# Patient Record
Sex: Female | Born: 1950 | Race: Black or African American | Hispanic: No | Marital: Married | Smoking: Current every day smoker
Health system: Southern US, Community
[De-identification: ages and names within clinical notes are randomized; demographics above are authoritative.]

## PROBLEM LIST (undated history)

## (undated) DIAGNOSIS — E119 Type 2 diabetes mellitus without complications: Secondary | ICD-10-CM

## (undated) DIAGNOSIS — E785 Hyperlipidemia, unspecified: Secondary | ICD-10-CM

## (undated) DIAGNOSIS — I10 Essential (primary) hypertension: Secondary | ICD-10-CM

## (undated) HISTORY — DX: Hyperlipidemia, unspecified: E78.5

## (undated) HISTORY — PX: ABDOMINAL HYSTERECTOMY: SHX81

## (undated) HISTORY — DX: Essential (primary) hypertension: I10

## (undated) HISTORY — DX: Type 2 diabetes mellitus without complications: E11.9

---

## 2016-11-08 LAB — HM MAMMOGRAPHY

## 2017-02-04 LAB — HEMOGLOBIN A1C: Hemoglobin A1C: 6.3 % — AB (ref 4.0–5.6)

## 2017-02-04 LAB — LIPID PANEL
CHOLESTEROL: 169 (ref 0–200)
HDL: 36 (ref 35–70)
LDL Cholesterol: 108
Triglycerides: 136 (ref 40–160)

## 2017-04-14 DIAGNOSIS — R69 Illness, unspecified: Secondary | ICD-10-CM | POA: Diagnosis not present

## 2017-04-23 ENCOUNTER — Other Ambulatory Visit: Payer: Self-pay

## 2017-04-23 ENCOUNTER — Ambulatory Visit (INDEPENDENT_AMBULATORY_CARE_PROVIDER_SITE_OTHER): Payer: Medicare HMO | Admitting: Nurse Practitioner

## 2017-04-23 ENCOUNTER — Encounter: Payer: Self-pay | Admitting: Nurse Practitioner

## 2017-04-23 VITALS — BP 167/102 | HR 79 | Temp 97.7°F | Ht 62.0 in | Wt 188.4 lb

## 2017-04-23 DIAGNOSIS — E1142 Type 2 diabetes mellitus with diabetic polyneuropathy: Secondary | ICD-10-CM | POA: Diagnosis not present

## 2017-04-23 DIAGNOSIS — I10 Essential (primary) hypertension: Secondary | ICD-10-CM | POA: Diagnosis not present

## 2017-04-23 DIAGNOSIS — Z7689 Persons encountering health services in other specified circumstances: Secondary | ICD-10-CM

## 2017-04-23 DIAGNOSIS — E785 Hyperlipidemia, unspecified: Secondary | ICD-10-CM

## 2017-04-23 DIAGNOSIS — R69 Illness, unspecified: Secondary | ICD-10-CM | POA: Diagnosis not present

## 2017-04-23 DIAGNOSIS — F17200 Nicotine dependence, unspecified, uncomplicated: Secondary | ICD-10-CM

## 2017-04-23 MED ORDER — ATORVASTATIN CALCIUM 80 MG PO TABS
80.0000 mg | ORAL_TABLET | Freq: Every day | ORAL | 3 refills | Status: DC
Start: 2017-04-23 — End: 2017-12-09

## 2017-04-23 MED ORDER — CHLORTHALIDONE 25 MG PO TABS: 12.5000 mg | ORAL_TABLET | Freq: Every day | ORAL | 3 refills | Status: DC

## 2017-04-23 NOTE — Patient Instructions (Addendum)
Alison Mays,   Thank you for coming in to clinic today.  1. For breast tenderness: - START Aleeve or naproxen sodium 440 mg twice daily for 14 days.  DO not take more than 14 days as it can worsen blood pressure. - AVOID caffeine.  Work toward drinking decaffeinated coffee.  At most, have 3 cups of caffeinated coffee per day.  2. CHANGE simvastatin to atorvastatin 80 mg once daily.  3. For blood pressure: - CONTINUE metoprolol - START chlorthalidone 25 mg tablet - Take half a tablet once daily for a dose of 12.5 mg. - Work on eating a low salt diet.  Please schedule a follow-up appointment with Alison Mays, AGNP. Return in about 1 month (around 05/21/2017) for diabetes, hypertension.  If you have any other questions or concerns, please feel free to call the clinic or send a message through [place]. You may also schedule an earlier appointment if necessary.  You will receive a survey after today's visit either digitally by e-mail or paper by [employer]. Your experiences and feedback matter to [place].  Please respond so we know how we are doing as we provide care for you.   Alison Smiles, DNP, AGNP-BC Adult Gerontology Nurse Practitioner [facility], [facility]   Managing Your Hypertension Hypertension is commonly called high blood pressure. This is when the force of your blood pressing against the walls of your arteries is too strong. Arteries are blood vessels that carry blood from your heart throughout your body. Hypertension forces the heart to work harder to pump blood, and may cause the arteries to become narrow or stiff. Having untreated or uncontrolled hypertension can cause heart attack, stroke, kidney disease, and other problems. What are blood pressure readings? A blood pressure reading consists of a higher number over a lower number. Ideally, your blood pressure should be below 120/80. The first ("top") number is called the systolic pressure. It is a measure of the  pressure in your arteries as your heart beats. The second ("bottom") number is called the diastolic pressure. It is a measure of the pressure in your arteries as the heart relaxes. What does my blood pressure reading mean? Blood pressure is classified into four stages. Based on your blood pressure reading, your health care provider may use the following stages to determine what type of treatment you need, if any. Systolic pressure and diastolic pressure are measured in a unit called mm Hg. Normal  Systolic pressure: below 128.  Diastolic pressure: below 80. Elevated  Systolic pressure: 786-767.  Diastolic pressure: below 80. Hypertension stage 1  Systolic pressure: 209-470.  Diastolic pressure: 96-28. Hypertension stage 2  Systolic pressure: 366 or above.  Diastolic pressure: 90 or above. What health risks are associated with hypertension? Managing your hypertension is an important responsibility. Uncontrolled hypertension can lead to:  A heart attack.  A stroke.  A weakened blood vessel (aneurysm).  Heart failure.  Kidney damage.  Eye damage.  Metabolic syndrome.  Memory and concentration problems.  What changes can I make to manage my hypertension? Hypertension can be managed by making lifestyle changes and possibly by taking medicines. Your health care provider will help you make a plan to bring your blood pressure within a normal range. Eating and drinking  Eat a diet that is high in fiber and potassium, and low in salt (sodium), added sugar, and fat. An example eating plan is called the DASH (Dietary Approaches to Stop Hypertension) diet. To eat this way: ? Eat  plenty of fresh fruits and vegetables. Try to fill half of your plate at each meal with fruits and vegetables. ? Eat whole grains, such as whole wheat pasta, brown rice, or whole grain bread. Fill about one quarter of your plate with whole grains. ? Eat low-fat diary products. ? Avoid fatty cuts of meat,  processed or cured meats, and poultry with skin. Fill about one quarter of your plate with lean proteins such as fish, chicken without skin, beans, eggs, and tofu. ? Avoid premade and processed foods. These tend to be higher in sodium, added sugar, and fat.  Reduce your daily sodium intake. Most people with hypertension should eat less than 1,500 mg of sodium a day.  Limit alcohol intake to no more than 1 drink a day for nonpregnant women and 2 drinks a day for men. One drink equals 12 oz of beer, 5 oz of wine, or 1 oz of hard liquor. Lifestyle  Work with your health care provider to maintain a healthy body weight, or to lose weight. Ask what an ideal weight is for you.  Get at least 30 minutes of exercise that causes your heart to beat faster (aerobic exercise) most days of the week. Activities may include walking, swimming, or biking.  Include exercise to strengthen your muscles (resistance exercise), such as weight lifting, as part of your weekly exercise routine. Try to do these types of exercises for 30 minutes at least 3 days a week.  Do not use any products that contain nicotine or tobacco, such as cigarettes and e-cigarettes. If you need help quitting, ask your health care provider.  Control any long-term (chronic) conditions you have, such as high cholesterol or diabetes. Monitoring  Monitor your blood pressure at home as told by your health care provider. Your personal target blood pressure may vary depending on your medical conditions, your age, and other factors.  Have your blood pressure checked regularly, as often as told by your health care provider. Working with your health care provider  Review all the medicines you take with your health care provider because there may be side effects or interactions.  Talk with your health care provider about your diet, exercise habits, and other lifestyle factors that may be contributing to hypertension.  Visit your health care  provider regularly. Your health care provider can help you create and adjust your plan for managing hypertension. Will I need medicine to control my blood pressure? Your health care provider may prescribe medicine if lifestyle changes are not enough to get your blood pressure under control, and if:  Your systolic blood pressure is 130 or higher.  Your diastolic blood pressure is 80 or higher.  Take medicines only as told by your health care provider. Follow the directions carefully. Blood pressure medicines must be taken as prescribed. The medicine does not work as well when you skip doses. Skipping doses also puts you at risk for problems. Contact a health care provider if:  You think you are having a reaction to medicines you have taken.  You have repeated (recurrent) headaches.  You feel dizzy.  You have swelling in your ankles.  You have trouble with your vision. Get help right away if:  You develop a severe headache or confusion.  You have unusual weakness or numbness, or you feel faint.  You have severe pain in your chest or abdomen.  You vomit repeatedly.  You have trouble breathing. Summary  Hypertension is when the force of blood pumping through  your arteries is too strong. If this condition is not controlled, it may put you at risk for serious complications.  Your personal target blood pressure may vary depending on your medical conditions, your age, and other factors. For most people, a normal blood pressure is less than 120/80.  Hypertension is managed by lifestyle changes, medicines, or both. Lifestyle changes include weight loss, eating a healthy, low-sodium diet, exercising more, and limiting alcohol. This information is not intended to replace advice given to you by your health care provider. Make sure you discuss any questions you have with your health care provider. Document Released: 10/02/2011 Document Revised: 12/06/2015 Document Reviewed:  12/06/2015 Elsevier Interactive Patient Education  [employer].

## 2017-04-23 NOTE — Progress Notes (Signed)
Subjective:    Patient ID: Alison Mays, female    DOB: Jan 15, 1951, 67 y.o.   MRN: [id number]  Alison Mays is a 67 y.o. female presenting on 04/23/2017 for Establish Care (recently moved to the area from [place], [place]. Bilateral breast tenderness, mammogram done 01/2017 informed she had Fibrocystic breasts )   HPI  [place] Provider Pt last seen by PCP about 4 months ago.  Obtain records from PCP Dr. Gareth Morgan  Ph: [phone]. Pt has been cared for by him for last 6-8 months.  Prior care was at [facility]  Breast Tenderness Patient reports significantly worsening breast tenderness that bothers her if she has any increased pressure applied to her breasts.  She notes she has recently had mammogram in January 2019 for screening. That exam revealed no cancer, but pt reports she has fibrocystic breasts.  Pt reports she had breast tenderness at the time of her mammogram, but it was much more mild.  Pt is postmenopausal s/p hysterectomy.  She has no prior history of breast ca.   - Pt drinks 12-15 cups per day caffeinated coffee.  Hyperlipidemia Pt has T2DM and is an active smoker.  Is taking simvastatin 80 mg once daily.  With this medication pt states she is having arthralgias that are worst with standing.  Occur throughout the day.  She did not experience these until she started taking this high-dose simvastatin.  Social History   Socioeconomic History  . Marital status: Married    Spouse name: Not on file  . Number of children: Not on file  . Years of education: Not on file  . Highest education level: Not on file  Occupational History  . Not on file  Social Needs  . Financial resource strain: Not on file  . Food insecurity:    Worry: Not on file    Inability: Not on file  . Transportation needs:    Medical: Not on file    Non-medical: Not on file  Tobacco Use  . Smoking status: Current Every Day Smoker    Packs/day: 0.25    Years: 10.00   Pack years: 2.50    Types: Cigarettes  . Smokeless tobacco: Never Used  Substance and Sexual Activity  . Alcohol use: Never    Frequency: Never  . Drug use: Never  . Sexual activity: Not on file  Lifestyle  . Physical activity:    Days per week: Not on file    Minutes per session: Not on file  . Stress: Not on file  Relationships  . Social connections:    Talks on phone: Not on file    Gets together: Not on file    Attends religious service: Not on file    Active member of club or organization: Not on file    Attends meetings of clubs or organizations: Not on file    Relationship status: Not on file  . Intimate partner violence:    Fear of current or ex partner: Not on file    Emotionally abused: Not on file    Physically abused: Not on file    Forced sexual activity: Not on file  Other Topics Concern  . Not on file  Social History Narrative  . Not on file   Family History  Problem Relation Age of Onset  . Healthy Mother   . Diabetes Father   . Heart attack Sister   . Diabetes Brother   . Diabetes Sister   .  Stomach cancer Sister   . Healthy Son   . Healthy Daughter    Past Medical History:  Diagnosis Date  . Diabetes mellitus without complication ([place])   . Hyperlipidemia   . Hypertension    Past Surgical History:  Procedure Laterality Date  . ABDOMINAL HYSTERECTOMY     Outpatient Encounter Medications as of 04/23/2017  Medication Sig  . cyclobenzaprine (FLEXERIL) 10 MG tablet Take 10 mg by mouth 3 (three) times daily as needed for muscle spasms.  Marland Kitchen gabapentin (NEURONTIN) 300 MG capsule Take 300 mg by mouth 3 (three) times daily.  Marland Kitchen glipiZIDE (GLUCOTROL) 10 MG tablet Take 10 mg by mouth 2 (two) times daily before a meal.  . losartan (COZAAR) 100 MG tablet Take 100 mg by mouth daily.  . metFORMIN (GLUCOPHAGE) 500 MG tablet Take 500 mg by mouth 2 (two) times daily with a meal.  . metoprolol tartrate (LOPRESSOR) 50 MG tablet Take 50 mg by mouth 2 (two) times  daily.  . [DISCONTINUED] simvastatin (ZOCOR) 80 MG tablet Take 80 mg by mouth daily.  Marland Kitchen atorvastatin (LIPITOR) 80 MG tablet Take 1 tablet (80 mg total) by mouth daily.  . chlorthalidone (HYGROTON) 25 MG tablet Take 0.5 tablets (12.5 mg total) by mouth daily.   No facility-administered encounter medications on file as of 04/23/2017.      Review of Systems  Constitutional: Negative.   HENT: Negative.   Eyes: Negative.   Respiratory: Negative.   Cardiovascular: Negative.   Gastrointestinal: Negative.   Endocrine: Negative.   Genitourinary:       Bilateral breast tenderness  Musculoskeletal: Positive for arthralgias.  Skin: Negative.   Allergic/Immunologic: Negative.   Neurological: Positive for numbness (left foot).  Hematological: Negative.   Psychiatric/Behavioral: Negative.    Per HPI unless specifically indicated above    Objective:    BP (!) 167/102 (BP Location: Right Arm, Patient Position: Sitting, Cuff Size: Normal)   Pulse 79   Temp 97.7 F (36.5 C) (Oral)   Ht 5\' 2"  (1.575 m)   Wt 188 lb 6.4 oz (85.5 kg)   BMI 34.46 kg/m   Wt Readings from Last 3 Encounters:  04/23/17 188 lb 6.4 oz (85.5 kg)    Physical Exam  Constitutional: She is oriented to person, place, and time. She appears well-developed and well-nourished. No distress.  HENT:  Head: Normocephalic and atraumatic.  Neck: Normal range of motion. Neck supple. Carotid bruit is not present.  Cardiovascular: Normal rate, regular rhythm, S1 normal, S2 normal, normal heart sounds and intact distal pulses.  No murmur heard. Pulmonary/Chest: Effort normal and breath sounds normal. No respiratory distress.  Breast - Normal exam w/ symmetric breasts, no mass, no nipple discharge, no skin changes.  Equal bilateral tenderness noted, more severe at nipples/areola.  No other abnormalities noted.    Musculoskeletal: She exhibits no edema (pedal).  Neurological: She is alert and oriented to person, place, and time.    Skin: Skin is warm and dry.  Psychiatric: She has a normal mood and affect. Her behavior is normal. Judgment and thought content normal.  Vitals reviewed.     Assessment & Plan:   Problem List Items Addressed This Visit      Cardiovascular and Mediastinum   Hypertension    Uncontrolled hypertension.  BP goal < 130/80.  Pt is not currently working on lifestyle modifications.  Taking medications tolerating well without side effects.   Plan: 1. Continue taking metoprolol 50 mg twice daily, losartan 100 mg  once daily - START chlorthalidone 12.5 mg once daily (take 1/2 of 25 mg tablet).  Instructed pt to purchase pill cutter.  Will monitor for hair loss.  Pt had hair loss with Triamterene-HCTZ in past. 2. Obtain labs CMP, lipid  3. Encouraged heart healthy diet and increasing exercise to 30 minutes most days of the week. 4. Check BP 1-2 x per week at home, keep log, and bring to clinic at next appointment. 5. Follow up 4 weeks.        Relevant Medications   metoprolol tartrate (LOPRESSOR) 50 MG tablet   losartan (COZAAR) 100 MG tablet   chlorthalidone (HYGROTON) 25 MG tablet   atorvastatin (LIPITOR) 80 MG tablet     Endocrine   Type 2 diabetes mellitus with peripheral neuropathy (HCC)    Unknown status for DM without prior A1c value available. Goal A1c < 6.3%. - Known complications of hyperlipidemia and left foot neuropathy.  Plan:  1. Continue current therapy: glipizide 10 mg bid wc, metformin 500 mg bid wc 2. Encourage improved lifestyle: - low carb/low glycemic diet handout provided - Increase physical activity to 30 minutes most days of the week.  Explained that increased physical activity increases body's use of sugar for energy. 3. Check fasting am CBG and bring log to next visit for review. 4. Continue ARB and Statin - changed from simvastatin 80 mg to atorvastatin 80 mg once daily 2/2 arthralgias at high dose simvastatin 5. DM Foot exam done today with normal findings.    and Advised to schedule DM ophtho exam, send record. 6. Follow-up 1 month for A1c       Relevant Medications   cyclobenzaprine (FLEXERIL) 10 MG tablet   gabapentin (NEURONTIN) 300 MG capsule   glipiZIDE (GLUCOTROL) 10 MG tablet   metFORMIN (GLUCOPHAGE) 500 MG tablet   losartan (COZAAR) 100 MG tablet   atorvastatin (LIPITOR) 80 MG tablet     Other   Hyperlipidemia - Primary    Current status unknown.  Pt with prior labs collected at former PCP.  Records to be requested.  Arthralgias on simvastatin 80 mg.  Will change to atorvastatin 80 mg for lipid control with high intensity statin in pt with diabetes.  Pt verbalizes understanding.  Labs at next visit after review of prior records.      Relevant Medications   metoprolol tartrate (LOPRESSOR) 50 MG tablet   losartan (COZAAR) 100 MG tablet   chlorthalidone (HYGROTON) 25 MG tablet   atorvastatin (LIPITOR) 80 MG tablet   Current every day smoker    Patient is active and current smoker with half pack per day.  Patient has smoking history of 2 packs/day with reduction of smoking over the last 6 months.  Discussion about smoking cessation during visit today less than 5 minutes.  Patient continues to understand impact of smoking on vascular system for heart attack and stroke risk in addition to lung cancer.  Declines additional treatment with medications at this time she has continued to be able to reduce her smoking amount.  Follow-up as needed in 1 month.        Other Visit Diagnoses    Encounter to establish care        Pt last seen by PCP about 4 months ago.  Obtain records from PCP Dr. Gareth Morgan  Ph: [phone]. Pt has been cared for by him for last 6-8 months.  Prior care was at [facility].  Past medical, family, and surgical history reviewed  w/ pt.     Meds ordered this encounter  Medications  . chlorthalidone (HYGROTON) 25 MG tablet    Sig: Take 0.5 tablets (12.5 mg total) by mouth daily.    Dispense:  15  tablet    Refill:  3    Order Specific Question:   Supervising Provider    Answer:   Olin Hauser [[id number]]  . atorvastatin (LIPITOR) 80 MG tablet    Sig: Take 1 tablet (80 mg total) by mouth daily.    Dispense:  90 tablet    Refill:  3    Order Specific Question:   Supervising Provider    Answer:   Olin Hauser [[id number]]    Follow up plan: Return in about 1 month (around 05/21/2017) for diabetes, hypertension.  [name], DNP, AGPCNP-BC Adult Gerontology Primary Care Nurse Practitioner [facility] 05/01/2017, 10:43 AM

## 2017-04-24 DIAGNOSIS — Z87891 Personal history of nicotine dependence: Secondary | ICD-10-CM | POA: Insufficient documentation

## 2017-04-24 DIAGNOSIS — E785 Hyperlipidemia, unspecified: Secondary | ICD-10-CM | POA: Insufficient documentation

## 2017-04-24 DIAGNOSIS — E1142 Type 2 diabetes mellitus with diabetic polyneuropathy: Secondary | ICD-10-CM | POA: Insufficient documentation

## 2017-04-24 NOTE — Assessment & Plan Note (Signed)
Uncontrolled hypertension.  BP goal < 130/80.  Pt is not currently working on lifestyle modifications.  Taking medications tolerating well without side effects.   Plan: 1. Continue taking metoprolol 50 mg twice daily, losartan 100 mg once daily - START chlorthalidone 12.5 mg once daily (take 1/2 of 25 mg tablet).  Instructed pt to purchase pill cutter.  Will monitor for hair loss.  Pt had hair loss with Triamterene-HCTZ in past. 2. Obtain labs CMP, lipid  3. Encouraged heart healthy diet and increasing exercise to 30 minutes most days of the week. 4. Check BP 1-2 x per week at home, keep log, and bring to clinic at next appointment. 5. Follow up 4 weeks.

## 2017-04-30 ENCOUNTER — Inpatient Hospital Stay
Admission: RE | Admit: 2017-04-30 | Discharge: 2017-04-30 | Disposition: A | Discharge status: Home / Self Care | Payer: Self-pay | Source: Ambulatory Visit | Attending: *Deleted | Admitting: *Deleted

## 2017-04-30 ENCOUNTER — Other Ambulatory Visit: Payer: Self-pay | Admitting: *Deleted

## 2017-04-30 DIAGNOSIS — Z9289 Personal history of other medical treatment: Secondary | ICD-10-CM

## 2017-05-01 ENCOUNTER — Other Ambulatory Visit: Payer: Self-pay | Admitting: Nurse Practitioner

## 2017-05-01 NOTE — Assessment & Plan Note (Signed)
Patient is active and current smoker with half pack per day.  Patient has smoking history of 2 packs/day with reduction of smoking over the last 6 months.  Discussion about smoking cessation during visit today less than 5 minutes.  Patient continues to understand impact of smoking on vascular system for heart attack and stroke risk in addition to lung cancer.  Declines additional treatment with medications at this time she has continued to be able to reduce her smoking amount.  Follow-up as needed in 1 month.

## 2017-05-01 NOTE — Assessment & Plan Note (Signed)
Current status unknown.  Pt with prior labs collected at former PCP.  Records to be requested.  Arthralgias on simvastatin 80 mg.  Will change to atorvastatin 80 mg for lipid control with high intensity statin in pt with diabetes.  Pt verbalizes understanding.  Labs at next visit after review of prior records.

## 2017-05-01 NOTE — Assessment & Plan Note (Signed)
Unknown status for DM without prior A1c value available. Goal A1c < 3.8%. - Known complications of hyperlipidemia and left foot neuropathy.  Plan:  1. Continue current therapy: glipizide 10 mg bid wc, metformin 500 mg bid wc 2. Encourage improved lifestyle: - low carb/low glycemic diet handout provided - Increase physical activity to 30 minutes most days of the week.  Explained that increased physical activity increases body's use of sugar for energy. 3. Check fasting am CBG and bring log to next visit for review. 4. Continue ARB and Statin - changed from simvastatin 80 mg to atorvastatin 80 mg once daily 2/2 arthralgias at high dose simvastatin 5. DM Foot exam done today with normal findings.   and Advised to schedule DM ophtho exam, send record. 6. Follow-up 1 month for A1c

## 2017-05-08 ENCOUNTER — Ambulatory Visit (INDEPENDENT_AMBULATORY_CARE_PROVIDER_SITE_OTHER): Payer: Medicare HMO | Admitting: Nurse Practitioner

## 2017-05-08 ENCOUNTER — Encounter: Payer: Self-pay | Admitting: Nurse Practitioner

## 2017-05-08 ENCOUNTER — Other Ambulatory Visit: Payer: Self-pay

## 2017-05-08 VITALS — BP 178/84 | HR 83 | Temp 98.1°F | Ht 63.0 in | Wt 186.0 lb

## 2017-05-08 DIAGNOSIS — E1142 Type 2 diabetes mellitus with diabetic polyneuropathy: Secondary | ICD-10-CM

## 2017-05-08 DIAGNOSIS — I10 Essential (primary) hypertension: Secondary | ICD-10-CM

## 2017-05-08 MED ORDER — GLIPIZIDE 10 MG PO TABS: 10.0000 mg | ORAL_TABLET | Freq: Two times a day (BID) | ORAL | 5 refills | Status: DC

## 2017-05-08 MED ORDER — CLONIDINE HCL 0.1 MG PO TABS
0.1000 mg | ORAL_TABLET | Freq: Once | ORAL | Status: AC
  Administered 2017-05-08: 0.1 mg via ORAL

## 2017-05-08 MED ORDER — CLONIDINE HCL 0.1 MG PO TABS
0.1000 mg | ORAL_TABLET | Freq: Once | ORAL | Status: AC
Start: 2017-05-08 — End: 2017-05-08
  Administered 2017-05-08: 0.1 mg via ORAL

## 2017-05-08 MED ORDER — METOPROLOL TARTRATE 50 MG PO TABS: 50.0000 mg | ORAL_TABLET | Freq: Two times a day (BID) | ORAL | 5 refills | Status: DC

## 2017-05-08 MED ORDER — CHLORTHALIDONE 25 MG PO TABS: 25.0000 mg | ORAL_TABLET | Freq: Every day | ORAL | 3 refills | Status: DC

## 2017-05-08 MED ORDER — AMLODIPINE BESYLATE 10 MG PO TABS: 10.0000 mg | ORAL_TABLET | Freq: Every day | ORAL | 3 refills | Status: DC

## 2017-05-08 NOTE — Progress Notes (Signed)
Subjective:    Patient ID: Alison Mays, female    DOB: 1950-04-29, 67 y.o.   MRN: [id number]  Alison Mays is a 67 y.o. female presenting on 05/08/2017 for Headache (ongoing for 3 days) and Epistaxis   HPI Hypertension with Epistaxis and headaches  Patient presents today with nosebleed and headache intermittently that began 2 days ago.  She has known history of uncontrolled hypertension with addition of chlorthalidone at last appointment 2 weeks ago. - Nosebleeds started Tuesday 2 days ago last 5-6 minutes, but is strong bleeding. - Pt has also been unable to sleep - Headaches began yesterday and are described as aching of R frontal/temple area. - Having some dizziness, tiredness, and not sleeping well were her signs that her blood pressure is too high. - This morning with nose bleed also had some sweating.  Otherwise, has not had any shortness of breath, chest pain, loss of speech or changes in speech, loss of consciousness, face drooping or arm/leg weakness.  Social History   Tobacco Use  . Smoking status: Current Every Day Smoker    Packs/day: 0.25    Years: 10.00    Pack years: 2.50    Types: Cigarettes  . Smokeless tobacco: Never Used  Substance Use Topics  . Alcohol use: Never    Frequency: Never  . Drug use: Never    Review of Systems Per HPI unless specifically indicated above     Objective:    BP (!) 197/108 (BP Location: Right Arm, Patient Position: Sitting, Cuff Size: Normal)   Pulse 83   Temp 98.1 F (36.7 C) (Oral)   Ht 5\' 3"  (1.6 m)   Wt 186 lb (84.4 kg)   BMI 32.95 kg/m   Wt Readings from Last 3 Encounters:  05/08/17 186 lb (84.4 kg)  04/23/17 188 lb 6.4 oz (85.5 kg)    Physical Exam  Constitutional: She is oriented to person, place, and time. She appears well-developed and well-nourished. No distress.  HENT:  Head: Normocephalic and atraumatic.  Neck: Normal range of motion. Neck supple. No JVD present. Carotid bruit is not present.    Cardiovascular: Normal rate, regular rhythm, S1 normal, S2 normal, normal heart sounds and intact distal pulses. Exam reveals no gallop and no friction rub.  No murmur heard. Pulmonary/Chest: Effort normal and breath sounds normal. No respiratory distress.  Musculoskeletal: She exhibits no edema (pedal).  Neurological: She is alert and oriented to person, place, and time. She is not disoriented. No cranial nerve deficit or sensory deficit. GCS eye subscore is 4. GCS verbal subscore is 5. GCS motor subscore is 6.  Skin: Skin is warm and dry. Capillary refill takes less than 2 seconds.  Psychiatric: She has a normal mood and affect. Her behavior is normal.  Vitals reviewed.     Assessment & Plan:   Problem List Items Addressed This Visit      Cardiovascular and Mediastinum   Hypertension   Relevant Medications   cloNIDine (CATAPRES) tablet 0.1 mg (Completed)   chlorthalidone (HYGROTON) 25 MG tablet   amLODipine (NORVASC) 10 MG tablet   metoprolol tartrate (LOPRESSOR) 50 MG tablet   cloNIDine (CATAPRES) tablet 0.1 mg (Completed)      Uncontrolled hypertension with hypertensive urgency in office.  No signs of hypertensive crisis or other acute pathology are present.  BP goal < 130/80.  Pt is working on lifestyle modifications for eating more baked foods.  Taking medications tolerating well without side effects. Complications today with  headache and epistaxis as signs of poorly controlled hypertension at high risk for hemorrhagic stroke. - Administration of clonidine 0.1 mg x 2 doses in office today have lowered BP enough for discharge home and resolved headache.  Plan: 1. START taking amlodipine 10 mg once daily - Increase chlorthalidone to 25 mg daily. - Continue taking metoprolol and losartan without changes 2. Reviewed FAST acronym for signs of stroke. - Reviewed hypertensive urgency and hypertensive crisis. - Pt verbalizes when she should seek care with PCP vs urgent care vs  calling 9-1-1 for ER visit.  3. Encouraged heart healthy diet and increasing exercise to 30 minutes most days of the week. 4. Check BP 1-2 x per week at home, keep log, and bring to clinic at next appointment.  Can validate home cuff with BP check at any time in clinic with [place] visit. 5. Follow up 2 weeks as scheduled 05/22/2017.     Meds ordered this encounter  Medications  . cloNIDine (CATAPRES) tablet 0.1 mg  . chlorthalidone (HYGROTON) 25 MG tablet    Sig: Take 1 tablet (25 mg total) by mouth daily.    Dispense:  15 tablet    Refill:  3    Order Specific Question:   Supervising Provider    Answer:   Olin Hauser [[id number]]  . amLODipine (NORVASC) 10 MG tablet    Sig: Take 1 tablet (10 mg total) by mouth daily.    Dispense:  90 tablet    Refill:  3    Order Specific Question:   Supervising Provider    Answer:   Olin Hauser [[id number]]  . metoprolol tartrate (LOPRESSOR) 50 MG tablet    Sig: Take 1 tablet (50 mg total) by mouth 2 (two) times daily.    Dispense:  60 tablet    Refill:  5    Order Specific Question:   Supervising Provider    Answer:   Olin Hauser [[id number]]  . glipiZIDE (GLUCOTROL) 10 MG tablet    Sig: Take 1 tablet (10 mg total) by mouth 2 (two) times daily before a meal.    Dispense:  60 tablet    Refill:  5    Order Specific Question:   Supervising Provider    Answer:   Olin Hauser [[id number]]  . cloNIDine (CATAPRES) tablet 0.1 mg    Follow up plan: Return for hypertension as scheduled on 05/22/2017.  [name], DNP, AGPCNP-BC Adult Gerontology Primary Care Nurse Practitioner [facility] 05/08/2017, 3:05 PM

## 2017-05-08 NOTE — Patient Instructions (Addendum)
[place],   Thank you for coming in to clinic today.  1. You are in hypertensive urgency.  This is when your blood pressure is very high and is not responding to medications well. - START amlodipine 10 mg once daily. - INCREASE chlorthalidone to 25 mg daily.  Take a whole tablet. - Continue metoprolol and losartan without changes.  2. Check BP at home.  Goal is < 130/80 for long term.  For now, we want BP to stay less than 180/100.  If higher, please go to urgent care or ER via EMS if sweaty, dizzy, having shortness of breath or chest pain.  3. Signs of stroke are: - Face weakness or drooping - Arm weakness (or leg weakness) - Speech changes (loss of speech, slurred, or sounds that are not words) - Time: act quickly, Call 9-1-1.  Preserve brain tissue!   Please schedule a follow-up appointment with [name], AGNP. Return for hypertension as scheduled on 05/22/2017.  If you have any other questions or concerns, please feel free to call the clinic or send a message through [place]. You may also schedule an earlier appointment if necessary.  You will receive a survey after today's visit either digitally by e-mail or paper by [employer]. Your experiences and feedback matter to [place].  Please respond so we know how we are doing as we provide care for you.   [name], DNP, AGNP-BC Adult Gerontology Nurse Practitioner [place]

## 2017-05-22 ENCOUNTER — Other Ambulatory Visit: Payer: Self-pay

## 2017-05-22 ENCOUNTER — Ambulatory Visit (INDEPENDENT_AMBULATORY_CARE_PROVIDER_SITE_OTHER): Payer: Medicare HMO | Admitting: Nurse Practitioner

## 2017-05-22 ENCOUNTER — Encounter: Payer: Self-pay | Admitting: Nurse Practitioner

## 2017-05-22 VITALS — BP 148/80 | HR 82 | Temp 98.1°F | Ht 63.0 in | Wt 187.8 lb

## 2017-05-22 DIAGNOSIS — B351 Tinea unguium: Secondary | ICD-10-CM

## 2017-05-22 DIAGNOSIS — E1142 Type 2 diabetes mellitus with diabetic polyneuropathy: Secondary | ICD-10-CM

## 2017-05-22 LAB — COMPLETE METABOLIC PANEL WITH GFR
AG Ratio: 1.6 (calc) (ref 1.0–2.5)
ALT: 16 U/L (ref 6–29)
AST: 17 U/L (ref 10–35)
Albumin: 4.2 g/dL (ref 3.6–5.1)
Alkaline phosphatase (APISO): 99 U/L (ref 33–130)
BUN: 17 mg/dL (ref 7–25)
CO2: 27 mmol/L (ref 20–32)
Calcium: 9.8 mg/dL (ref 8.6–10.4)
Chloride: 104 mmol/L (ref 98–110)
Creat: 0.85 mg/dL (ref 0.50–0.99)
GFR, Est African American: 83 mL/min/{1.73_m2} (ref >=60)
GFR, Est Non African American: 72 mL/min/{1.73_m2} (ref >=60)
Globulin: 2.6 g/dL (calc) (ref 1.9–3.7)
Glucose, Bld: 152 mg/dL — ABNORMAL HIGH (ref 65–99)
Potassium: 3.9 mmol/L (ref 3.5–5.3)
Sodium: 141 mmol/L (ref 135–146)
Total Bilirubin: 0.4 mg/dL (ref 0.2–1.2)
Total Protein: 6.8 g/dL (ref 6.1–8.1)

## 2017-05-22 LAB — LIPID PANEL
Cholesterol: 141 mg/dL (ref <200)
HDL: 37 mg/dL — ABNORMAL LOW (ref >50)
LDL Cholesterol (Calc): 89 mg/dL (calc)
Non-HDL Cholesterol (Calc): 104 mg/dL (calc) (ref <130)
Total CHOL/HDL Ratio: 3.8 (calc) (ref <5.0)
Triglycerides: 66 mg/dL (ref <150)

## 2017-05-22 LAB — POCT GLYCOSYLATED HEMOGLOBIN (HGB A1C): Hemoglobin A1C: 7.4

## 2017-05-22 MED ORDER — METFORMIN HCL ER 750 MG PO TB24: 750.0000 mg | ORAL_TABLET | Freq: Every day | ORAL | 6 refills | Status: DC

## 2017-05-22 NOTE — Progress Notes (Signed)
Subjective:    Patient ID: Alison Mays, female    DOB: Dec 12, 1950, 67 y.o.   MRN: [id number]  Alison Mays is a 67 y.o. female presenting on 05/22/2017 for Diabetes   HPI Diabetes Pt presents today for follow up of Type 2 diabetes mellitus. She is checking CBG at home with a range of 135 today, 109-190; usually is under 120, but with poor diet has been 130-190 - Current diabetic medications include: metformin - with side effects of nausea and stomach ache - She is symptomatic with polyuria, polyphagia, polydipsia, neuropathy with paresthesias in BLE.  - She denies headaches, diaphoresis, shakiness, chills and changes in vision.   - Clinical course has been worsening. - She  reports no regular exercise routine. - Her diet is moderate in salt, moderate in fat, and high in carbohydrates.  She notes diet changes with increased ice cream cheesecake consumption since being in [place]. - Weight trend: stable  PREVENTION: Eye exam current (within one year): no Foot exam current (within one year): no Lipid/ASCVD risk reduction - on statin: yes Kidney protection - on ace or arb: yes Recent Labs    05/22/17 1037  HGBA1C 7.4%     Patient reports that she has been approved for therapeutic shoes in [place] before she moved to [place].  She never received them.  She is qualified because of:  R foot callus under MTP 5th digit, neuropathy   Social History   Tobacco Use  . Smoking status: Current Every Day Smoker    Packs/day: 0.25    Years: 10.00    Pack years: 2.50    Types: Cigarettes  . Smokeless tobacco: Never Used  Substance Use Topics  . Alcohol use: Never    Frequency: Never  . Drug use: Never    Review of Systems Per HPI unless specifically indicated above     Objective:    BP (!) 148/80 (BP Location: Right Arm, Patient Position: Sitting, Cuff Size: Normal)   Pulse 82   Temp 98.1 F (36.7 C) (Oral)   Ht 5\' 3"  (1.6 m)   Wt 187 lb 12.8 oz (85.2  kg)   BMI 33.27 kg/m   Wt Readings from Last 3 Encounters:  05/22/17 187 lb 12.8 oz (85.2 kg)  05/08/17 186 lb (84.4 kg)  04/23/17 188 lb 6.4 oz (85.5 kg)    Physical Exam  Constitutional: She is oriented to person, place, and time. She appears well-developed and well-nourished. No distress.  HENT:  Head: Normocephalic and atraumatic.  Eyes: Pupils are equal, round, and reactive to light. EOM are normal.  Neck: Normal range of motion. Neck supple. Carotid bruit is not present.  Cardiovascular: Normal rate, regular rhythm, S1 normal, S2 normal, normal heart sounds and intact distal pulses.  Pulmonary/Chest: Effort normal and breath sounds normal. No respiratory distress.  Abdominal: Soft. Bowel sounds are normal. She exhibits no distension. There is no hepatosplenomegaly. There is no tenderness. No hernia.  Musculoskeletal: She exhibits no edema (pedal).  Neurological: She is alert and oriented to person, place, and time.  Skin: Skin is warm and dry.  Psychiatric: She has a normal mood and affect. Her behavior is normal.  Vitals reviewed.   Diabetic Foot Exam - Simple   Simple Foot Form Diabetic Foot exam was performed with the following findings:  Yes 05/22/2017 10:00 AM  Visual Inspection See comments:  Yes Sensation Testing Intact to touch and monofilament testing bilaterally:  Yes Pulse Check Posterior  Tibialis and Dorsalis pulse intact bilaterally:  Yes Comments Patient with callus at plantar aspect of toe near MTP of fifth digit of right foot Mycotic nails throughout bilateral lower extremities Normal sensation, but patient is experiencing tingling from neuropathy      Results for orders placed or performed in visit on 05/22/17  POCT glycosylated hemoglobin (Hb A1C)  Result Value Ref Range   Hemoglobin A1C 7.4%       Assessment & Plan:   Problem List Items Addressed This Visit      Endocrine   Type 2 diabetes mellitus with peripheral neuropathy (HCC) - Primary    Relevant Medications   metFORMIN (GLUCOPHAGE-XR) 750 MG 24 hr tablet   Other Relevant Orders   POCT glycosylated hemoglobin (Hb A1C) (Completed)   Lipid panel   COMPLETE METABOLIC PANEL WITH GFR   Ambulatory referral to Podiatry    Other Visit Diagnoses    Fungal nail infection        UncontrolledDM with A1c 7.4% Worsening control from patient reported 6.8% in [place] and goal A1c < 7.0%. - Complications - peripheral neuropathy and hyperglycemia.  Plan:  1. Change therapy: Increase metformin.  Because of GI intolerance, start metformin XR 750 mg twice daily with meals 2. Encourage improved lifestyle: - low carb/low glycemic diet handout provided - Increase physical activity to 30 minutes most days of the week.  Explained that increased physical activity increases body's use of sugar for energy. 3. Check fasting am CBG and bring log to next visit for review 4. Continue ASA, ACEi and Statin 5. DM Foot exam done today with normal findings.   and Advised to schedule DM ophtho exam, send record.  Podiatry referral made for regular diabetic foot care as is difficult for pt to trim nails with mycotic fungal nail infection 6. Follow-up 3 months    Meds ordered this encounter  Medications  . metFORMIN (GLUCOPHAGE-XR) 750 MG 24 hr tablet    Sig: Take 1 tablet (750 mg total) by mouth daily with breakfast.    Dispense:  60 tablet    Refill:  6    May change to 90-day supply if pt prefers    Order Specific Question:   Supervising Provider    Answer:   Olin Hauser [[id number]]    Follow up plan: Return in about 3 months (around 08/22/2017) for diabetes.  [name], DNP, AGPCNP-BC Adult Gerontology Primary Care Nurse Practitioner [facility] 05/22/2017, 6:11 PM

## 2017-05-22 NOTE — Patient Instructions (Addendum)
Alison Mays,   Thank you for coming in to clinic today.  1. Change to metformin XR 750 mg twice daily.  2. Continue glipizide and all other medications without change.  3. Your provider would like to you have your annual eye exam. Please contact your current eye doctor or here are some good options for you to contact.   [facility]   Address: [address] [place], [place] [address] Phone: [phone]  Website: [email or web address] [address], [place], [place] [address] Phone: [phone] [email or web address]  [facility]  Address: [place], [place], [place] [address] Phone: [phone]   [facility] [address] [place], [place] [address] Phone: [phone]  [facility] Address: [place], [place], [place] [address]  Phone: [phone]  [facility]. [facility] [place], [place] [address]  [phone]  **[place], [place] [address] [phone] - Diabetic shoes with fittings.  Sleep Hygiene Tips  Take medicines only as directed by your health care provider.  Keep regular sleeping and waking hours. Avoid naps.  Keep a sleep diary to help you and your health care provider figure out what could be causing your insomnia. Include:  When you sleep.  When you wake up during the night.  How well you sleep.  How rested you feel the next day.  Any side effects of medicines you are taking.  What you eat and drink.  Make your bedroom a comfortable place where it is easy to fall asleep:  Put up shades or special blackout curtains to block light from outside.  Use a white noise machine to block noise.  Keep the temperature cool.  Exercise regularly as directed by your health care provider. Avoid exercising right before bedtime.  Use relaxation techniques to manage stress. Ask your health care provider to suggest some techniques that  may work well for you. These may include:  Breathing exercises.  Routines to release muscle tension.  Visualizing peaceful scenes.  Cut back on alcohol, caffeinated beverages, and cigarettes, especially close to bedtime. These can disrupt your sleep.  Do not overeat or eat spicy foods right before bedtime. This can lead to digestive discomfort that can make it hard for you to sleep.  Limit screen use before bedtime. This includes:  Watching TV.  Using your smartphone, tablet, and computer.  Stick to a routine. This can help you fall asleep faster. Try to do a quiet activity, brush your teeth, and go to bed at the same time each night.  Get out of bed if you are still awake after 15 minutes of trying to sleep. Keep the lights down, but try reading or doing a quiet activity. When you feel sleepy, go back to bed.  Make sure that you drive carefully. Avoid driving if you feel very sleepy.  Keep all follow-up appointments as directed by your health care provider. This is important.   Please schedule a follow-up appointment with [name], AGNP. Return in about 3 months (around 08/22/2017) for diabetes.  If you have any other questions or concerns, please feel free to call the clinic or send a message through [place]. You may also schedule an earlier appointment if necessary.  You will receive a survey after today's visit either digitally by e-mail or paper by [employer]. Your experiences and feedback matter to [place].  Please respond so  we know how we are doing as we provide care for you.   [name], DNP, AGNP-BC Adult Gerontology Nurse Practitioner [place]

## 2017-05-27 ENCOUNTER — Ambulatory Visit: Payer: Medicare HMO

## 2017-05-29 DIAGNOSIS — R69 Illness, unspecified: Secondary | ICD-10-CM | POA: Diagnosis not present

## 2017-06-05 ENCOUNTER — Ambulatory Visit: Payer: Self-pay | Admitting: Podiatry

## 2017-06-19 DIAGNOSIS — E785 Hyperlipidemia, unspecified: Secondary | ICD-10-CM | POA: Diagnosis not present

## 2017-06-19 DIAGNOSIS — R69 Illness, unspecified: Secondary | ICD-10-CM | POA: Diagnosis not present

## 2017-06-19 DIAGNOSIS — M79605 Pain in left leg: Secondary | ICD-10-CM | POA: Diagnosis not present

## 2017-06-19 DIAGNOSIS — I10 Essential (primary) hypertension: Secondary | ICD-10-CM | POA: Diagnosis not present

## 2017-06-19 DIAGNOSIS — M62838 Other muscle spasm: Secondary | ICD-10-CM | POA: Diagnosis not present

## 2017-06-19 DIAGNOSIS — R609 Edema, unspecified: Secondary | ICD-10-CM | POA: Diagnosis not present

## 2017-06-19 DIAGNOSIS — E1142 Type 2 diabetes mellitus with diabetic polyneuropathy: Secondary | ICD-10-CM | POA: Diagnosis not present

## 2017-06-19 DIAGNOSIS — Z6833 Body mass index (BMI) 33.0-33.9, adult: Secondary | ICD-10-CM | POA: Diagnosis not present

## 2017-06-19 DIAGNOSIS — E669 Obesity, unspecified: Secondary | ICD-10-CM | POA: Diagnosis not present

## 2017-06-19 DIAGNOSIS — G8929 Other chronic pain: Secondary | ICD-10-CM | POA: Diagnosis not present

## 2017-06-27 ENCOUNTER — Other Ambulatory Visit: Payer: Self-pay | Admitting: Nurse Practitioner

## 2017-06-27 DIAGNOSIS — I10 Essential (primary) hypertension: Secondary | ICD-10-CM

## 2017-07-01 ENCOUNTER — Ambulatory Visit (INDEPENDENT_AMBULATORY_CARE_PROVIDER_SITE_OTHER): Payer: Medicare HMO

## 2017-07-01 VITALS — BP 128/72 | HR 68 | Temp 98.2°F | Resp 15 | Ht 63.0 in | Wt 187.6 lb

## 2017-07-01 DIAGNOSIS — Z0000 Encounter for general adult medical examination without abnormal findings: Secondary | ICD-10-CM

## 2017-07-01 DIAGNOSIS — Z1211 Encounter for screening for malignant neoplasm of colon: Secondary | ICD-10-CM

## 2017-07-01 NOTE — Progress Notes (Addendum)
For complaint of dizziness, should not be related to low BP after amlodipine increase as BP is normal today.  Would recommend monitoring for worsening of or persistent symptoms.  Can see pt in office visit to address if this persists.

## 2017-07-01 NOTE — Patient Instructions (Signed)
Ms. Stammen , Thank you for taking time to come for your Medicare Wellness Visit. I appreciate your ongoing commitment to your health goals. Please review the following plan we discussed and let me know if I can assist you in the future.   Screening recommendations/referrals: Colonoscopy: cologuard ordered Mammogram: waiting for medical records Bone Density: waiting for medical records Recommended yearly ophthalmology/optometry visit for glaucoma screening and checkup Recommended yearly dental visit for hygiene and checkup  Vaccinations: Influenza vaccine: due 09/2017 Pneumococcal vaccine: declined today Tdap vaccine: due, check with your insurance company for coverage  Shingles vaccine: due, check with your insurance company for coverage     Advanced directives: Advance directive discussed with you today. I have provided a copy for you to complete at home and have notarized. Once this is complete please bring a copy in to our office so we can scan it into your chart.  Conditions/risks identified: smoking cessation discussed  Next appointment: Follow up on 07/25/2017 at 9:00am with Lissa Merlin. Follow up in one year for your annual wellness exam.    Preventive Care 65 Years and Older, Female Preventive care refers to lifestyle choices and visits with your health care provider that can promote health and wellness. What does preventive care include?  A yearly physical exam. This is also called an annual well check.  Dental exams once or twice a year.  Routine eye exams. Ask your health care provider how often you should have your eyes checked.  Personal lifestyle choices, including:  Daily care of your teeth and gums.  Regular physical activity.  Eating a healthy diet.  Avoiding tobacco and drug use.  Limiting alcohol use.  Practicing safe sex.  Taking low-dose aspirin every day.  Taking vitamin and mineral supplements as recommended by your health care  provider. What happens during an annual well check? The services and screenings done by your health care provider during your annual well check will depend on your age, overall health, lifestyle risk factors, and family history of disease. Counseling  Your health care provider may ask you questions about your:  Alcohol use.  Tobacco use.  Drug use.  Emotional well-being.  Home and relationship well-being.  Sexual activity.  Eating habits.  History of falls.  Memory and ability to understand (cognition).  Work and work Statistician.  Reproductive health. Screening  You may have the following tests or measurements:  Height, weight, and BMI.  Blood pressure.  Lipid and cholesterol levels. These may be checked every 5 years, or more frequently if you are over 59 years old.  Skin check.  Lung cancer screening. You may have this screening every year starting at age 82 if you have a 30-pack-year history of smoking and currently smoke or have quit within the past 15 years.  Fecal occult blood test (FOBT) of the stool. You may have this test every year starting at age 16.  Flexible sigmoidoscopy or colonoscopy. You may have a sigmoidoscopy every 5 years or a colonoscopy every 10 years starting at age 2.  Hepatitis C blood test.  Hepatitis B blood test.  Sexually transmitted disease (STD) testing.  Diabetes screening. This is done by checking your blood sugar (glucose) after you have not eaten for a while (fasting). You may have this done every 1-3 years.  Bone density scan. This is done to screen for osteoporosis. You may have this done starting at age 19.  Mammogram. This may be done every 1-2 years. Talk to your health  care provider about how often you should have regular mammograms. Talk with your health care provider about your test results, treatment options, and if necessary, the need for more tests. Vaccines  Your health care provider may recommend certain  vaccines, such as:  Influenza vaccine. This is recommended every year.  Tetanus, diphtheria, and acellular pertussis (Tdap, Td) vaccine. You may need a Td booster every 10 years.  Zoster vaccine. You may need this after age 70.  Pneumococcal 13-valent conjugate (PCV13) vaccine. One dose is recommended after age 51.  Pneumococcal polysaccharide (PPSV23) vaccine. One dose is recommended after age 14. Talk to your health care provider about which screenings and vaccines you need and how often you need them. This information is not intended to replace advice given to you by your health care provider. Make sure you discuss any questions you have with your health care provider. Document Released: 02/03/2015 Document Revised: 09/27/2015 Document Reviewed: 11/08/2014 Elsevier Interactive Patient Education  2017 [place] Prevention in the Home Falls can cause injuries. They can happen to people of all ages. There are many things you can do to make your home safe and to help prevent falls. What can I do on the outside of my home?  Regularly fix the edges of walkways and driveways and fix any cracks.  Remove anything that might make you trip as you walk through a door, such as a raised step or threshold.  Trim any bushes or trees on the path to your home.  Use bright outdoor lighting.  Clear any walking paths of anything that might make someone trip, such as rocks or tools.  Regularly check to see if handrails are loose or broken. Make sure that both sides of any steps have handrails.  Any raised decks and porches should have guardrails on the edges.  Have any leaves, snow, or ice cleared regularly.  Use sand or salt on walking paths during winter.  Clean up any spills in your garage right away. This includes oil or grease spills. What can I do in the bathroom?  Use night lights.  Install grab bars by the toilet and in the tub and shower. Do not use towel bars as grab  bars.  Use non-skid mats or decals in the tub or shower.  If you need to sit down in the shower, use a plastic, non-slip stool.  Keep the floor dry. Clean up any water that spills on the floor as soon as it happens.  Remove soap buildup in the tub or shower regularly.  Attach bath mats securely with double-sided non-slip rug tape.  Do not have throw rugs and other things on the floor that can make you trip. What can I do in the bedroom?  Use night lights.  Make sure that you have a light by your bed that is easy to reach.  Do not use any sheets or blankets that are too big for your bed. They should not hang down onto the floor.  Have a firm chair that has side arms. You can use this for support while you get dressed.  Do not have throw rugs and other things on the floor that can make you trip. What can I do in the kitchen?  Clean up any spills right away.  Avoid walking on wet floors.  Keep items that you use a lot in easy-to-reach places.  If you need to reach something above you, use a strong step stool that has a grab  bar.  Keep electrical cords out of the way.  Do not use floor polish or wax that makes floors slippery. If you must use wax, use non-skid floor wax.  Do not have throw rugs and other things on the floor that can make you trip. What can I do with my stairs?  Do not leave any items on the stairs.  Make sure that there are handrails on both sides of the stairs and use them. Fix handrails that are broken or loose. Make sure that handrails are as long as the stairways.  Check any carpeting to make sure that it is firmly attached to the stairs. Fix any carpet that is loose or worn.  Avoid having throw rugs at the top or bottom of the stairs. If you do have throw rugs, attach them to the floor with carpet tape.  Make sure that you have a light switch at the top of the stairs and the bottom of the stairs. If you do not have them, ask someone to add them for  you. What else can I do to help prevent falls?  Wear shoes that:  Do not have high heels.  Have rubber bottoms.  Are comfortable and fit you well.  Are closed at the toe. Do not wear sandals.  If you use a stepladder:  Make sure that it is fully opened. Do not climb a closed stepladder.  Make sure that both sides of the stepladder are locked into place.  Ask someone to hold it for you, if possible.  Clearly mark and make sure that you can see:  Any grab bars or handrails.  First and last steps.  Where the edge of each step is.  Use tools that help you move around (mobility aids) if they are needed. These include:  Canes.  Walkers.  Scooters.  Crutches.  Turn on the lights when you go into a dark area. Replace any light bulbs as soon as they burn out.  Set up your furniture so you have a clear path. Avoid moving your furniture around.  If any of your floors are uneven, fix them.  If there are any pets around you, be aware of where they are.  Review your medicines with your doctor. Some medicines can make you feel dizzy. This can increase your chance of falling. Ask your doctor what other things that you can do to help prevent falls. This information is not intended to replace advice given to you by your health care provider. Make sure you discuss any questions you have with your health care provider. Document Released: 11/03/2008 Document Revised: 06/15/2015 Document Reviewed: 02/11/2014 Elsevier Interactive Patient Education  2017 [place] provider would like to you have your annual eye exam. Please contact your current eye doctor or here are some good options for you to contact.   [facility] Address: [address] [place], [place] [address]   Address: [address] [place], [place] [address]  Phone: [phone]      Phone: [phone]  Website: [email or web address]    Website: [email or web address]      [facility]  Address: [place], [place], [place] [address]   Address: [place], [place], [place] [address] Phone: [phone]      Phone: [phone]    [facility] Address: [place],  [place] [address]  Phone: [phone]   Steps to Quit Smoking Smoking tobacco can be bad for your health. It can also affect almost every organ in your body. Smoking puts you and people around you at risk for many serious long-lasting (chronic) diseases. Quitting smoking is hard, but it is one of the best things that you can do for your health. It is never too late to quit. What are the benefits of quitting smoking? When you quit smoking, you lower your risk for getting serious diseases and conditions. They can include:  Lung cancer or lung disease.  Heart disease.  Stroke.  Heart attack.  Not being able to have children (infertility).  Weak bones (osteoporosis) and broken bones (fractures).  If you have coughing, wheezing, and shortness of breath, those symptoms may get better when you quit. You may also get sick less often. If you are pregnant, quitting smoking can help to lower your chances of having a baby of low birth weight. What can I do to help me quit smoking? Talk with your doctor about what can help you quit smoking. Some things you can do (strategies) include:  Quitting smoking totally, instead of slowly cutting back how much you smoke over a period of time.  Going to in-person counseling. You are more likely to quit if you go to many counseling sessions.  Using resources and support systems, such as: ? Database administrator with a Social worker. ? Phone quitlines. ? Careers information officer. ? Support groups or group counseling. ? Text messaging programs. ? Mobile phone apps or applications.  Taking medicines. Some of these medicines may have nicotine in them. If you are pregnant or breastfeeding, do not take any medicines  to quit smoking unless your doctor says it is okay. Talk with your doctor about counseling or other things that can help you.  Talk with your doctor about using more than one strategy at the same time, such as taking medicines while you are also going to in-person counseling. This can help make quitting easier. What things can I do to make it easier to quit? Quitting smoking might feel very hard at first, but there is a lot that you can do to make it easier. Take these steps:  Talk to your family and friends. Ask them to support and encourage you.  Call phone quitlines, reach out to support groups, or work with a Social worker.  Ask people who smoke to not smoke around you.  Avoid places that make you want (trigger) to smoke, such as: ? Bars. ? Parties. ? Smoke-break areas at work.  Spend time with people who do not smoke.  Lower the stress in your life. Stress can make you want to smoke. Try these things to help your stress: ? Getting regular exercise. ? Deep-breathing exercises. ? Yoga. ? Meditating. ? Doing a body scan. To do this, close your eyes, focus on one area of your body at a time from head to toe, and notice which parts of your body are tense. Try to relax the muscles in those areas.  Download or buy apps on your mobile phone or tablet that can help you stick to your quit plan. There are many free apps, such as QuitGuide from the [employer] Office manager for Disease Control and Prevention). You can find more support from smokefree.gov and other websites.  This information is not intended to replace advice given to you by your health care provider. Make sure you discuss any questions you have with  your health care provider. Document Released: 11/03/2008 Document Revised: 09/05/2015 Document Reviewed: 05/24/2014 Elsevier Interactive Patient Education  2018 [employer].

## 2017-07-01 NOTE — Progress Notes (Signed)
Subjective:   Alison Mays is a 67 y.o. female who presents for an Initial Medicare Annual Wellness Visit.  Review of Systems      Cardiac Risk Factors include: advanced age (>8men, >35 women);hypertension;dyslipidemia;obesity (BMI >30kg/m2);smoking/ tobacco exposure;diabetes mellitus     Objective:    Today's Vitals   07/01/17 1438  BP: 128/72  Pulse: 68  Resp: 15  Temp: 98.2 F (36.8 C)  TempSrc: Oral  Weight: 187 lb 9.6 oz (85.1 kg)  Height: 5\' 3"  (1.6 m)   Body mass index is 33.23 kg/m.  Advanced Directives 07/01/2017  Does Patient Have a Medical Advance Directive? No  Would patient like information on creating a medical advance directive? Yes (MAU/Ambulatory/Procedural Areas - Information given)    Current Medications (verified) Outpatient Encounter Medications as of 07/01/2017  Medication Sig  . amLODipine (NORVASC) 10 MG tablet Take 1 tablet (10 mg total) by mouth daily.  Marland Kitchen atorvastatin (LIPITOR) 80 MG tablet Take 1 tablet (80 mg total) by mouth daily.  . chlorthalidone (HYGROTON) 25 MG tablet TAKE 1 TABLET(25 MG) BY MOUTH DAILY  . cyclobenzaprine (FLEXERIL) 10 MG tablet Take 10 mg by mouth 3 (three) times daily as needed for muscle spasms.  Marland Kitchen gabapentin (NEURONTIN) 300 MG capsule Take 300 mg by mouth 3 (three) times daily.  Marland Kitchen glipiZIDE (GLUCOTROL) 10 MG tablet Take 1 tablet (10 mg total) by mouth 2 (two) times daily before a meal.  . losartan (COZAAR) 100 MG tablet Take 100 mg by mouth daily.  . metFORMIN (GLUCOPHAGE-XR) 750 MG 24 hr tablet Take 1 tablet (750 mg total) by mouth daily with breakfast.  . metoprolol tartrate (LOPRESSOR) 50 MG tablet Take 1 tablet (50 mg total) by mouth 2 (two) times daily.   No facility-administered encounter medications on file as of 07/01/2017.     Allergies (verified) Patient has no known allergies.   History: Past Medical History:  Diagnosis Date  . Diabetes mellitus without complication ([place])   . Hyperlipidemia     . Hypertension    Past Surgical History:  Procedure Laterality Date  . ABDOMINAL HYSTERECTOMY     Family History  Problem Relation Age of Onset  . Healthy Mother   . Diabetes Father   . Heart attack Sister   . Diabetes Brother   . Diabetes Sister   . Stomach cancer Sister   . Healthy Son   . Healthy Daughter    Social History   Socioeconomic History  . Marital status: Married    Spouse name: Not on file  . Number of children: Not on file  . Years of education: Not on file  . Highest education level: Not on file  Occupational History  . Not on file  Social Needs  . Financial resource strain: Not hard at all  . Food insecurity:    Worry: Never true    Inability: Never true  . Transportation needs:    Medical: No    Non-medical: No  Tobacco Use  . Smoking status: Former Smoker    Packs/day: 0.25    Years: 10.00    Pack years: 2.50    Types: Cigarettes    Last attempt to quit: 07/01/2017  . Smokeless tobacco: Never Used  Substance and Sexual Activity  . Alcohol use: Never    Frequency: Never  . Drug use: Never  . Sexual activity: Not on file  Lifestyle  . Physical activity:    Days per week: 0 days  Minutes per session: 0 min  . Stress: Not at all  Relationships  . Social connections:    Talks on phone: More than three times a week    Gets together: Once a week    Attends religious service: Never    Active member of club or organization: No    Attends meetings of clubs or organizations: Never    Relationship status: Married  Other Topics Concern  . Not on file  Social History Narrative  . Not on file    Tobacco Counseling Counseling given: Yes   Clinical Intake:  Pre-visit preparation completed: Yes  Pain : No/denies pain     Nutritional Status: BMI > 30  Obese Nutritional Risks: Nausea/ vomitting/ diarrhea(nausea recently ) Diabetes: Yes CBG done?: No Did pt. bring in CBG monitor from home?: No  How often do you need to have someone  help you when you read instructions, pamphlets, or other written materials from your doctor or pharmacy?: 1 - Never What is the last grade level you completed in school?: 12th grade   Interpreter Needed?: No  Information entered by :: Tiffany Hill,LPN    Activities of Daily Living In your present state of health, do you have any difficulty performing the following activities: 07/01/2017  Hearing? N  Vision? Y  Difficulty concentrating or making decisions? N  Walking or climbing stairs? Y  Comment SOB   Dressing or bathing? N  Doing errands, shopping? N  Preparing Food and eating ? N  Using the Toilet? N  In the past six months, have you accidently leaked urine? N  Do you have problems with loss of bowel control? N  Managing your Medications? N  Managing your Finances? N  Housekeeping or managing your Housekeeping? N     Immunizations and Health Maintenance  There is no immunization history on file for this patient. Health Maintenance Due  Topic Date Due  . Hepatitis C Screening  [birth year]  . OPHTHALMOLOGY EXAM  09/28/1961  . HIV Screening  09/29/1966  . TETANUS/TDAP  09/29/1970  . MAMMOGRAM  09/28/2001  . COLONOSCOPY  09/28/2001  . DEXA SCAN  09/28/2016  . PNA vac Low Risk Adult (1 of 2 - PCV13) 09/28/2016    Patient Care Team: Mikey College, NP as PCP - General (Nurse Practitioner)  Indicate any recent Medical Services you may have received from other than [facility] providers in the past year (date may be approximate).     Assessment:   This is a routine wellness examination for Alison Mays.  Hearing/Vision screen Vision Screening Comments: No eye doctor currently, recommendations given.   Dietary issues and exercise activities discussed: Current Exercise Habits: Home exercise routine, Type of exercise: walking, Time (Minutes): 30, Frequency (Times/Week): 1, Weekly Exercise (Minutes/Week): 30, Intensity: Mild, Exercise limited by: None identified  Goals    .  Quit Smoking     Smoking cessation discussed      Depression Screen PHQ 2/9 Scores 07/01/2017 05/08/2017  PHQ - 2 Score 0 1  PHQ- 9 Score - 7    Fall Risk Fall Risk  07/01/2017  Falls in the past year? Yes  Number falls in past yr: 1  Injury with Fall? No  Follow up Falls prevention discussed    Is the patient's home free of loose throw rugs in walkways, pet beds, electrical cords, etc?   yes      Grab bars in the bathroom? yes      Handrails on the stairs?  no      Adequate lighting?   yes  Timed Get Up and Go Performed Completed in 8 seconds with no use of assistive devices, steady gait. No intervention needed at this time.   Cognitive Function:     6CIT Screen 07/01/2017  What Year? 0 points  What month? 0 points  What time? 0 points  Count back from 20 0 points  Months in reverse 0 points  Repeat phrase 0 points  Total Score 0    Screening Tests Health Maintenance  Topic Date Due  . Hepatitis C Screening  04-Feb-1950  . OPHTHALMOLOGY EXAM  09/28/1961  . HIV Screening  09/29/1966  . TETANUS/TDAP  09/29/1970  . MAMMOGRAM  09/28/2001  . COLONOSCOPY  09/28/2001  . DEXA SCAN  09/28/2016  . PNA vac Low Risk Adult (1 of 2 - PCV13) 09/28/2016  . INFLUENZA VACCINE  08/21/2017  . HEMOGLOBIN A1C  11/22/2017  . FOOT EXAM  05/23/2018  . PAP SMEAR  Discontinued    Declined pneumonia vaccine today, will receive at future visit.   Qualifies for Shingles Vaccine? Yes, discussed shingrix vaccine  Cancer Screenings: Lung: Low Dose CT Chest recommended if Age 17-80 years, 30 pack-year currently smoking OR have quit w/in 15years. Patient does not qualify. Breast: Up to date on Mammogram? Yes  Waiting on medical records Up to date of Bone Density/Dexa? Yes waiting on medical records Colorectal: cologuard ordered  Additional Screenings:  Hepatitis C Screening: waiting for medical records     Plan:    I have personally reviewed and addressed the Medicare Annual Wellness  questionnaire and have noted the following in the patient's chart:  A. Medical and social history B. Use of alcohol, tobacco or illicit drugs  C. Current medications and supplements D. Functional ability and status E.  Nutritional status F.  Physical activity G. Advance directives H. List of other physicians I.  Hospitalizations, surgeries, and ER visits in previous 12 months J.  [place] such as hearing and vision if needed, cognitive and depression L. Referrals and appointments   In addition, I have reviewed and discussed with patient certain preventive protocols, quality metrics, and best practice recommendations. A written personalized care plan for preventive services as well as general preventive health recommendations were provided to patient.   Signed,  Tyler Aas, LPN Nurse Health Advisor   Nurse Notes: awaiting medical records from PCP in [place] to update HM.  Patient complaining of dizziness for the last 2 weeks with occasional nausea, happens are random periods of the day.  Isnt sure if it is medication related, increased amlodipine to 1 tablet instead of half a tablet when she was last seen in May.

## 2017-07-03 ENCOUNTER — Telehealth: Payer: Self-pay

## 2017-07-03 NOTE — Telephone Encounter (Signed)
-----   Message from Mikey College, NP sent at 07/01/2017  4:32 PM EDT ----- Please call pt to request visit if dizziness persists.  Was not able to see her today.  Unlikely is BP as BP is normal today.

## 2017-07-03 NOTE — Telephone Encounter (Signed)
I contacted the pt to f/u on the dizziness. She complains of dizziness w/ sudden movement x 1 week. Symptoms are improving, but not resolved. She was unable to come in on Friday, June 14th. Pt was scheduled for a Acute visit with Dr. Parks Ranger on Monday, June 17th @ 1:40pm.

## 2017-07-07 ENCOUNTER — Ambulatory Visit: Payer: Self-pay | Admitting: Family Medicine

## 2017-07-08 ENCOUNTER — Encounter: Payer: Self-pay | Admitting: Family Medicine

## 2017-07-08 ENCOUNTER — Ambulatory Visit
Admission: RE | Admit: 2017-07-08 | Discharge: 2017-07-08 | Disposition: A | Discharge status: Home / Self Care | Payer: Medicare HMO | Source: Ambulatory Visit | Attending: Family Medicine | Admitting: Family Medicine

## 2017-07-08 ENCOUNTER — Ambulatory Visit (INDEPENDENT_AMBULATORY_CARE_PROVIDER_SITE_OTHER): Payer: Medicare HMO | Admitting: Family Medicine

## 2017-07-08 ENCOUNTER — Other Ambulatory Visit: Payer: Self-pay

## 2017-07-08 VITALS — BP 106/67 | HR 81 | Temp 98.4°F | Ht 63.0 in | Wt 189.4 lb

## 2017-07-08 DIAGNOSIS — R0781 Pleurodynia: Secondary | ICD-10-CM | POA: Diagnosis not present

## 2017-07-08 DIAGNOSIS — R0609 Other forms of dyspnea: Secondary | ICD-10-CM

## 2017-07-08 DIAGNOSIS — R079 Chest pain, unspecified: Secondary | ICD-10-CM | POA: Diagnosis not present

## 2017-07-08 DIAGNOSIS — R600 Localized edema: Secondary | ICD-10-CM | POA: Diagnosis not present

## 2017-07-08 DIAGNOSIS — R0602 Shortness of breath: Secondary | ICD-10-CM | POA: Diagnosis not present

## 2017-07-08 DIAGNOSIS — R0600 Dyspnea, unspecified: Secondary | ICD-10-CM

## 2017-07-08 NOTE — Patient Instructions (Addendum)
Thank you for coming to the office today.  At this time I do not have a clear answer for your symptoms. It may end up being related to smoking.  The good news is that EKG is reassuring overall, without obvious problem with heart. There is one area that is questionable but I do not have a prior EKG image to compare it to. It does not meet criteria to pursue further testing immediately.  The X-ray is normal. No evidence of fluid, pneumonia or infection or other abnormality.  We will wait on blood test results later today to notify you of these results. Potentially an on call doctor may reach your or I would be able to call you, if you do not hear back by tomorrow call our  Office first thing in morning.  If any significant abnormality on blood test we may ask that you are seen at hospital ED for potential risk as we discussed of blood clot. This seems less likely.  Recommend for dizziness to try reducing blood pressure pill Chlorthalidone from 25mg  daily down back to HALF tab daily for now - dizzy may be due to low BP.  Lastly we may need to refer you to [facility] to do additional testing again - we can do this locally and urgently if needed, or if you do not improve may ask that you go to hospital sooner for ED evaluation if worsening sudden short of breath, swelling, chest pain or tightness  It does not seem you have inner ear problem - but just incase you may try the following  For possible Vertigo  - To treat this, try the Epley Manuever (see diagrams/instructions below) at home up to 3 times a day for 1-2 weeks or until symptoms resolve - You may take Meclizine as needed up to 3 times a day for dizziness, this will not cure symptoms but may help. Caution may make you drowsy.  If you develop significant worsening episode with vertigo that does not improve and you get severe headache, loss of vision, arm or leg weakness, slurred speech, or other concerning symptoms please seek immediate  medical attention at Emergency Department.  See the next page for images describing the Epley Manuever.     ----------------------------------------------------------------------------------------------------------------------        Please schedule a Follow-up Appointment to: Return in about 1 week (around 07/15/2017), or if symptoms worsen or fail to improve, for dyspnea.  If you have any other questions or concerns, please feel free to call the office or send a message through [place]. You may also schedule an earlier appointment if necessary.  Additionally, you may be receiving a survey about your experience at our office within a few days to 1 week by e-mail or mail. We value your feedback.  Nobie Putnam, DO [place]

## 2017-07-08 NOTE — Progress Notes (Signed)
Subjective:    Patient ID: Alison Mays, female    DOB: 04-17-1950, 67 y.o.   MRN: [id number]  Alison Mays is a 67 y.o. female presenting on 07/08/2017 for Dizziness (intermittent dizzines, off-balance x 3 weeks ) and Shortness of Breath (w/ exertion x 2 weeks , pt quit smoking x 2 days ago.)  Patient presents for a same day appointment. Her regular PCP is [name], AGPCNP-BC, who is not available in office today.  HPI   DYSPNEA ON EXERTION / DIZZINESS EPISODIC  Reports first onset symptoms about 3 weeks ago with acute dizziness and off balance episodes 1-2 times almost every day, duration is very brief lasting for only seconds, does not seem to endorse room spinning or vertigo, improves with rest, not tried any medications, possible trigger she thinks standing up too suddenly seems to make it worse, uncertain if position changes. Seems gradual worsening over past few weeks, has not had any days without dizziness. Not tried any OTC medications.  Additional complaint with shortness of breath gradually worsening over past 2 weeks. She thinks smoking was worsening her breathing and reducing her exercise tolerance. Her shortness of breath is only with exertion, at rest she seems to not have significant problem. Will only have mild discomfort on deep breathing some pleuritic sharper pain in chest. - Long history of smoking over past 40 years, intermittent smoking, < half ppd about 0.25, former history of Advair use >10-15 years ago, was dx with asthma due to smoking, some relief and eventually did not need this anymore. Currently does not have any inhaler or albuterol - Additionally she has history of followed by Cardiologist in [place], and she was diagnosed with some "fluid on the heart" she attributed this PPI medication in past, about 3-4 years ago she had ECHO and Stress Test in past. No history of MI or other heart problem. We are still waiting on outside records from her  previous provider. - Denies active chest pain on exam or chest tightness at rest  - Admits some mild bilateral lower extremity swelling lower legs and ankles and feet, both similar without one worsening, she just noticed this within past few days, without redness pain or asymmetry. She denies any significant immobilization or long travel, recent surgery, no known personal or family history of DVT or PE. - She does have history of anemia, used to take iron pills, no longer, and no lab available for CBC result.   Depression screen [facility] 2/9 07/01/2017 05/08/2017  Decreased Interest 0 0  Down, Depressed, Hopeless 0 1  PHQ - 2 Score 0 1  Altered sleeping - 3  Tired, decreased energy - 3  Change in appetite - 0  Feeling bad or failure about yourself  - 0  Trouble concentrating - 0  Moving slowly or fidgety/restless - 0  Suicidal thoughts - 0  PHQ-9 Score - 7  Difficult doing work/chores - Not difficult at all    Social History   Tobacco Use  . Smoking status: Former Smoker    Packs/day: 0.25    Years: 40.00    Pack years: 10.00    Types: Cigarettes    Last attempt to quit: 07/01/2017    Years since quitting: 0.0  . Smokeless tobacco: Never Used  . Tobacco comment: intermittent smoker over past 40 years, quit at time up to 6 months  Substance Use Topics  . Alcohol use: Never    Frequency: Never  . Drug use: Never  Review of Systems Per HPI unless specifically indicated above     Objective:      BP 106/67 (BP Location: Right Arm, Patient Position: Sitting, Cuff Size: Normal)   Pulse 81   Temp 98.4 F (36.9 C) (Oral)   Ht 5\' 3"  (1.6 m)   Wt 189 lb 6.4 oz (85.9 kg)   SpO2 96%   BMI 33.55 kg/m   Wt Readings from Last 3 Encounters:  07/08/17 189 lb 6.4 oz (85.9 kg)  07/01/17 187 lb 9.6 oz (85.1 kg)  05/22/17 187 lb 12.8 oz (85.2 kg)    Physical Exam  Constitutional: She is oriented to person, place, and time. She appears well-developed and well-nourished. No distress.    Well-appearing, seems to be very comfortable, cooperative, obese  HENT:  Head: Normocephalic and atraumatic.  Mouth/Throat: Oropharynx is clear and moist.  Frontal / maxillary sinuses non-tender. Nares patent without purulence or edema. Bilateral TMs clear without erythema, effusion or bulging. Oropharynx clear without erythema, exudates, edema or asymmetry.  Dix-Hallpike maneuver was negative for any reproduced dizziness, vertigo or nystagmus. She did endorse mild dizziness up sitting to standing transition.  Eyes: Conjunctivae are normal. Right eye exhibits no discharge. Left eye exhibits no discharge.  Neck: Normal range of motion. Neck supple. No thyromegaly present.  Cardiovascular: Normal rate, regular rhythm, normal heart sounds and intact distal pulses. Exam reveals no friction rub.  No murmur heard. Pulmonary/Chest: Breath sounds normal. No respiratory distress. She has no wheezes. She has no rales. She exhibits no tenderness and no retraction.  Very mild reduced respiratory effort or slightly reduced air movement, but overall still well preserved air movement. Conversational speaks full sentences without difficulty, no sign of increased work of breathing at rest.  Musculoskeletal: Normal range of motion.       Right lower leg: She exhibits edema (non pitting). She exhibits no tenderness.       Left lower leg: She exhibits edema (non pitting, symemtrical lower extremities limited to lower ankle to foot, calves appear unaffected, not measured today appear symmetrical). She exhibits no tenderness.  Lymphadenopathy:    She has no cervical adenopathy.  Neurological: She is alert and oriented to person, place, and time.  Skin: Skin is warm and dry. No rash noted. She is not diaphoretic. No erythema.  Psychiatric: She has a normal mood and affect. Her behavior is normal.  Well groomed, good eye contact, normal speech and thoughts  Nursing note and vitals reviewed.    I have personally  reviewed the radiology report from 07/08/17 STAT CXR  CLINICAL DATA:  Acute chest pain and shortness of breath for 3 weeks.  EXAM: CHEST - 2 VIEW  COMPARISON:  None.  FINDINGS: The cardiomediastinal silhouette is unremarkable.  There is no evidence of focal airspace disease, pulmonary edema, suspicious pulmonary nodule/mass, pleural effusion, or pneumothorax.  No acute bony abnormalities are identified.  IMPRESSION: No active cardiopulmonary disease.   Electronically Signed   By: Margarette Canada M.D.   On: 07/08/2017 15:34  ------------------------------------------------------ EKG - performed in office today  Date: 07/08/17  Rate: 69  Rhythm: normal sinus rhythm  QRS Axis: normal  Intervals: normal  ST/T Wave abnormalities: nonspecific T wave changes with minimal T-wave inversion isolated V6, non specific change in V5 T-wave. No evidence of acute STEMI  Conduction Disutrbances:none  Additional Narrative Interpretation: No evidence of S1Q3T3 pattern. No diffuse TWI  Old EKG Reviewed: none available   Results for orders placed or performed in  visit on 07/08/17  TIQ-NTM  Result Value Ref Range   QUESTION/PROBLEM:     SPECIMEN(S) RECEIVED: 2 FROZEN SST       Assessment & Plan:   Problem List Items Addressed This Visit    None    Visit Diagnoses    Dyspnea on exertion    -  Primary   Relevant Orders   CBC   D-Dimer, Quantitative   BASIC METABOLIC PANEL WITH GFR   Brain natriuretic peptide   EKG 12-Lead   DG Chest 2 View (Completed)   Pleuritic chest pain       Relevant Orders   CBC   D-Dimer, Quantitative   BASIC METABOLIC PANEL WITH GFR   Brain natriuretic peptide   EKG 12-Lead   DG Chest 2 View (Completed)   Bilateral lower extremity edema       Relevant Orders   CBC   D-Dimer, Quantitative   BASIC METABOLIC PANEL WITH GFR   Brain natriuretic peptide      Subacute now worsening constellation of symptoms concerning for possible  cardiopulmonary etiology. Primarily with DOE seems worsening over few weeks, actually worse not better after recent quit smoking, long history of tobacco abuse but no prior dx COPD, prior dx Asthma >10-15 years off maintenance therapy, however lungs clear without wheezing or bronchospasm today, seems less likely. - Currently hemodynamically stable - except BP is mildly lower than previous readings, is on multiple medications - Considered other differential: - cardiac anginal equivalent in female 14 yr diabetic patient with risk factors, has some prior cardiac history of effusion but has had normal stress test and ECHO in past 3-4 years awaiting outside records. Otherwise consider possible CHF with edema and possible pericardial effusion - consider pulmonary infection vs PE / VTE - Well's Score for PE is 0. Well's score for DVT is 0. Reassuring less likely.  Plan Initial plan offered given her significant symptoms offered that she may go to hospital ED for further evaluation promptly but she requested to be worked up outpatient instead  - Check STAT EKG today - reviewed results above w/ patient, only one isolated abnormality mild TWI in V6, otherwise unremarkable, no prior comparison EKG for review. - Check STAT CXR - reviewed results above w/ patient in office, reassurance with negative result, no sign of infection or fluid or other acute abnormality - Check STAT Labs - BMET, CBC, BNP, D-Dimer - recent labs did not include CBC, will rule out anemia with history of this, also will do screening for possible CHF vs VTE with D-dimer, again clinical suspicion is lower, therefore will consider these tests to support this suspicion and if normal then will continue to follow-up, otherwise if acutely abnormal have requested patient she will need to be seen at hospital ED  For dizziness, seems less likely to be Vertigo or BPPV, seems more likely orthostatic, will request repeat BP orthostatic measurement at  future follow-up if symptoms persist - Recommended for now reduce Chlorthalidone by HALF today from 25 to 12.5mg  daily to see if reduced anti HTN med can reduce some of her dizziness, but would not necessarily explain her dyspnea  Deferred other treatment at this time until rest of labs result. If all work-up is negative, and symptoms persist I would offer more urgent Cardiology referral and then future we can consider Pulmonology. If still no evidence of infection would defer antibiotics. We could consider Prednisone if thought worse bronchospasm however deferred this since obese/edema/diabetic for now.  No orders of the defined types were placed in this encounter.     Follow up plan: Return in about 1 week (around 07/15/2017), or if symptoms worsen or fail to improve, for dyspnea.  A total of >40 minutes was spent face-to-face with this patient. Greater than 50% of this time was spent in counseling on possible differential diagnoses, reviewing recommended tests and results, with regards to her concerning symptoms today including labs, EKG, Chest X-ray and coordination of care with provider on call regarding Stat Lab results, called and discussed with Dr Park Liter to review the patients case and anticipated STAT lab results to be resulting later tonight 07/08/17 PM and reviewed recommended plan pending the results, if acutely abnormal D-Dimer or BNP may need to be seen at hospital, otherwise we will follow-up with patient later this week to review results.  Nobie Putnam, [facility] 07/08/2017, 6:09 PM

## 2017-07-09 ENCOUNTER — Other Ambulatory Visit: Payer: Self-pay

## 2017-07-09 ENCOUNTER — Telehealth: Payer: Self-pay | Admitting: Family Medicine

## 2017-07-09 ENCOUNTER — Other Ambulatory Visit: Payer: Self-pay | Admitting: Family Medicine

## 2017-07-09 ENCOUNTER — Telehealth: Payer: Self-pay

## 2017-07-09 ENCOUNTER — Other Ambulatory Visit: Payer: Medicare HMO

## 2017-07-09 DIAGNOSIS — R0781 Pleurodynia: Secondary | ICD-10-CM | POA: Diagnosis not present

## 2017-07-09 DIAGNOSIS — R0609 Other forms of dyspnea: Principal | ICD-10-CM

## 2017-07-09 DIAGNOSIS — R0600 Dyspnea, unspecified: Secondary | ICD-10-CM

## 2017-07-09 DIAGNOSIS — R600 Localized edema: Secondary | ICD-10-CM

## 2017-07-09 LAB — D-DIMER, QUANTITATIVE: D-Dimer, Quant: 0.41 mcg/mL FEU (ref <0.50)

## 2017-07-09 LAB — BRAIN NATRIURETIC PEPTIDE: Brain Natriuretic Peptide: 15 pg/mL (ref <100)

## 2017-07-09 NOTE — Telephone Encounter (Addendum)
UPDATE 620pm 07/09/17  Called patient back. Reviewed all lab results with her now that all are resulted.  D-dimer 0.41 = negative. With already low pre-test probability for PE / DVT this supports that it is unlikely cause of her symptoms.  BNP 15 = negative. Unlikely to have CHF causing her dyspnea.  Additionally reviewed other results - BMET with elevated Cr 1.05 concern for mild dehydration, also K of 3.3, she admits today that she was not drinking as much water before she usually drinks 2 bottles, now today she has been drinking more. - CBC did show a mild elevated WBC 12.6, suspect hemoconcentration, given her history was not consistent with infection etiology  Clinical update - she states no significant change today. She did take HALF of chlorthalidone 25mg  today instead, and has not noticed much change. Her BP was still normal. Still episodic dizzy and still dyspnea on exertion. Denies chest pain or any worsening.  I advised her that now my concerns for the dizziness especially on standing may be due to mild dehydration may cause low blood volume and orthostatic drop in blood pressure. She will try improving her fluid intake, and may try 1 more day of half tab Chlorthalidone then can try HOLDING it completely to see if improved dizziness.  Regarding her dyspnea on exertion, I advised her that ultimately she will likely need repeat cardiology evaluation, last done 3-4 years ago reportedly normal, awaiting on records. May need future ischemic work-up and stress test. I will anticipate placing urgent cardiology referral after confirming with her that she agrees to pursue this as next step. Advised her if any significant worsening symptoms or specifically chest pain or pressure, overnight or within next few days to week before apt that she should go directly to the hospital for further testing there.  Will place urgent referral now and see if our staff can contact [facility] and follow-up by  phone tomorrow to review this request.  Nobie Putnam, [facility] 07/09/2017, 6:31 PM

## 2017-07-09 NOTE — Telephone Encounter (Signed)
Duplicate phone note created. See other telephone encounter from earlier today 07/09/17.  Nobie Putnam, [facility] 07/09/2017, 6:23 PM

## 2017-07-09 NOTE — Telephone Encounter (Signed)
See my office visit note from yesterday for background clinical history.  Additionally I did leave voicemail with patient late last night 11pm est after the STAT results of rest of tests D-dimer BNP were not resulted and received notice on EHR that they were delayed. I advised patient on message that we would talk today and redraw labs and if any acute worse symptoms as before she should go to hospital ED. I did not speak with her directly last night  Discussed pending stat labs today with Victorino Sparrow CMA, see information below. Patient has been informed and we will wait on new lab results before determining further plans for her treatment.  If D Dimer and BNP are normal - then I would recommend referral to Cardiology for further evaluation, may need ECHO as well along with updated ECHOcardiogram.  Otherwise if abnormal or regardless, will discuss with patient and she may consider hospital evaluation for more prompt cardiac work-up.  Nobie Putnam, [facility] 07/09/2017, 10:43 AM

## 2017-07-09 NOTE — Telephone Encounter (Signed)
The pt was contacted this morning for her to come back in to the office to get her labs re- drawn this morning by the lab tech. She had some stat labs done yesterday CBC, CMP, BNP, and D-dimer. The BNP and D-dimer was not properly submitted to the labs per Donnie Mesa, CMA. The BNP had to be in a frozen lavender and the D-dimer in a frozen blue serum tube. The pt reports overall no changes in her symptoms and no dizzy spell so far this morning.  The lab test was reorder by Dr. Neoma Laming stat and drawn by the [place] lab tech. The pt was notified if her symptoms worsen she needs to go to the ER. She verbalize understanding.

## 2017-07-09 NOTE — Addendum Note (Signed)
Addended by: Olin Hauser on: 07/09/2017 06:39 PM   Modules accepted: Orders

## 2017-07-10 LAB — BASIC METABOLIC PANEL WITH GFR
BUN/Creatinine Ratio: 22 (calc) (ref 6–22)
BUN: 23 mg/dL (ref 7–25)
CALCIUM: 9.7 mg/dL (ref 8.6–10.4)
CHLORIDE: 104 mmol/L (ref 98–110)
CO2: 25 mmol/L (ref 20–32)
CREATININE: 1.05 mg/dL — AB (ref 0.50–0.99)
GFR, Est African American: 65 mL/min/{1.73_m2} (ref >=60)
GFR, Est Non African American: 56 mL/min/{1.73_m2} — ABNORMAL LOW (ref >=60)
GLUCOSE: 80 mg/dL (ref 65–99)
Potassium: 3.3 mmol/L — ABNORMAL LOW (ref 3.5–5.3)
Sodium: 140 mmol/L (ref 135–146)

## 2017-07-10 LAB — CBC
HCT: 35.8 % (ref 35.0–45.0)
HEMOGLOBIN: 12.1 g/dL (ref 11.7–15.5)
MCH: 26.4 pg — ABNORMAL LOW (ref 27.0–33.0)
MCHC: 33.8 g/dL (ref 32.0–36.0)
MCV: 78 fL — ABNORMAL LOW (ref 80.0–100.0)
MPV: 10.3 fL (ref 7.5–12.5)
PLATELETS: 448 10*3/uL — AB (ref 140–400)
RBC: 4.59 10*6/uL (ref 3.80–5.10)
RDW: 14.8 % (ref 11.0–15.0)
WBC: 12.6 10*3/uL — ABNORMAL HIGH (ref 3.8–10.8)

## 2017-07-10 LAB — TIQ-NTM

## 2017-07-10 LAB — BRAIN NATRIURETIC PEPTIDE

## 2017-07-10 LAB — D-DIMER, QUANTITATIVE (NOT AT ARMC)

## 2017-07-10 NOTE — Telephone Encounter (Addendum)
I contact [facility] to f/u on the urgent referral. I was informed that the referral coordinater Theodis Blaze) is in the process of scheduling that appointment.

## 2017-07-14 ENCOUNTER — Other Ambulatory Visit: Payer: Self-pay

## 2017-07-14 ENCOUNTER — Other Ambulatory Visit: Payer: Self-pay | Admitting: Nurse Practitioner

## 2017-07-14 DIAGNOSIS — E1142 Type 2 diabetes mellitus with diabetic polyneuropathy: Secondary | ICD-10-CM

## 2017-07-14 MED ORDER — METFORMIN HCL ER 750 MG PO TB24: 750.0000 mg | ORAL_TABLET | Freq: Two times a day (BID) | ORAL | 6 refills | Status: DC

## 2017-07-15 ENCOUNTER — Ambulatory Visit: Payer: Medicare HMO

## 2017-07-17 ENCOUNTER — Ambulatory Visit: Payer: Medicare HMO | Admitting: Cardiovascular Disease

## 2017-07-23 DIAGNOSIS — R69 Illness, unspecified: Secondary | ICD-10-CM | POA: Diagnosis not present

## 2017-07-25 ENCOUNTER — Encounter: Payer: Medicare HMO | Admitting: Nurse Practitioner

## 2017-07-29 ENCOUNTER — Ambulatory Visit (INDEPENDENT_AMBULATORY_CARE_PROVIDER_SITE_OTHER): Payer: Medicare HMO | Admitting: Nurse Practitioner

## 2017-07-29 ENCOUNTER — Other Ambulatory Visit: Payer: Self-pay

## 2017-07-29 ENCOUNTER — Encounter: Payer: Self-pay | Admitting: Nurse Practitioner

## 2017-07-29 VITALS — BP 143/73 | HR 76 | Ht 63.0 in | Wt 190.0 lb

## 2017-07-29 DIAGNOSIS — G629 Polyneuropathy, unspecified: Secondary | ICD-10-CM

## 2017-07-29 DIAGNOSIS — E1142 Type 2 diabetes mellitus with diabetic polyneuropathy: Secondary | ICD-10-CM

## 2017-07-29 DIAGNOSIS — R079 Chest pain, unspecified: Secondary | ICD-10-CM | POA: Diagnosis not present

## 2017-07-29 DIAGNOSIS — R0602 Shortness of breath: Secondary | ICD-10-CM

## 2017-07-29 DIAGNOSIS — F17200 Nicotine dependence, unspecified, uncomplicated: Secondary | ICD-10-CM | POA: Diagnosis not present

## 2017-07-29 DIAGNOSIS — R252 Cramp and spasm: Secondary | ICD-10-CM | POA: Diagnosis not present

## 2017-07-29 DIAGNOSIS — R69 Illness, unspecified: Secondary | ICD-10-CM | POA: Diagnosis not present

## 2017-07-29 MED ORDER — VARENICLINE TARTRATE 0.5 MG X 11 & 1 MG X 42 PO MISC: ORAL | 0 refills | Status: DC

## 2017-07-29 MED ORDER — UMECLIDINIUM BROMIDE 62.5 MCG/INH IN AEPB: 1.0000 | INHALATION_SPRAY | Freq: Every day | RESPIRATORY_TRACT | 2 refills | Status: DC

## 2017-07-29 MED ORDER — ALBUTEROL SULFATE (2.5 MG/3ML) 0.083% IN NEBU: 2.5000 mg | INHALATION_SOLUTION | Freq: Four times a day (QID) | RESPIRATORY_TRACT | 12 refills | Status: DC | PRN

## 2017-07-29 MED ORDER — CYCLOBENZAPRINE HCL 10 MG PO TABS: 10.0000 mg | ORAL_TABLET | Freq: Three times a day (TID) | ORAL | 1 refills | Status: DC | PRN

## 2017-07-29 MED ORDER — VARENICLINE TARTRATE 1 MG PO TABS: 1.0000 mg | ORAL_TABLET | Freq: Two times a day (BID) | ORAL | 1 refills | Status: DC

## 2017-07-29 MED ORDER — GABAPENTIN 300 MG PO CAPS: 300.0000 mg | ORAL_CAPSULE | Freq: Three times a day (TID) | ORAL | 5 refills | Status: DC

## 2017-07-29 NOTE — Patient Instructions (Addendum)
[place],   Thank you for coming in to clinic today.  1. Echocardiogram and Stress Test are to be ordered for you to have at [facility].  2. You will also have a Cardiology referral  3. For smoking cessation, we are going to start chantix.   Start varenicline (Chantix) 7 days BEFORE your quit date.    Your quit date: 08/03/2017      START Chantix on: 07/29/17  - Day 1-3: Take one 0.5mg  tablet once daily WITH FOOD - Days 4-7: increase to one 0.5mg  tablet twice per day.  - Days 7- 12 weeks max: Increase to one 1mg  tablet twice per day for up to 12 weeks  To quit smoking:  - Only start this treatment if you are mentally ready to quit. - Start Chantix 1 week before your quit date.  If unable to quit completely in 7 days, then work to reduce by 50% or more by 4 weeks, then another 50% reduction in 4 more weeks, and lastly quit after a final 4 weeks.  - We can continue Chantix for up to 12 weeks total if needed for maintenance. - Common side effects include nausea (take with food), headaches, insomnia, vivid dreams, mood instability, seizure, and a rare risk of suicidal ideation.  If you have any serious agitation or acute depression with severe mood changes you need to stop taking the medicine immediately   Please schedule a follow-up appointment with [name], AGNP. Return in about 1 month (around 08/26/2017) for smoking cessation.  If you have any other questions or concerns, please feel free to call the clinic or send a message through [place]. You may also schedule an earlier appointment if necessary.  You will receive a survey after today's visit either digitally by e-mail or paper by [employer]. Your experiences and feedback matter to [place].  Please respond so we know how we are doing as we provide care for you.   [name], DNP, AGNP-BC Adult Gerontology Nurse Practitioner [place]

## 2017-07-29 NOTE — Progress Notes (Signed)
Subjective:    Patient ID: Alison Mays, female    DOB: [birth year], 67 y.o.   MRN: [id number]  Alison Mays is a 67 y.o. female presenting on 07/29/2017 for Shortness of Breath (and fatigue with exertion and walking short distance )   HPI Shortness of Breath Patient has continued having fatigue, exertional shortness of breath, and intermittent left sided chest pains that occur at rest and with activity.  No paroxysmal nocturnal dyspnea, leg swelling, racing heart rate, or other symptoms. - Patient has long history of smoking and has reduced cigarette use to currently using 1 pack per 3-4 days.  No inhaler use in past and no prior pulmonology referrals or PFTs. - Patient presented with similar symptoms approximately 3 weeks ago and was evaluated by Dr. Parks Ranger without identified cause of symptoms and without likely ACS. ED visit was declined at that time.  Medication Refills: Patient reports continued foot spasms that have been controlled in past by cyclobenzaprine.  She also has peripheral neuropathy relieved by gabapentin.  These are long-term medications that currently require new prescription for additional refills.  When taking medications, both conditions are well controlled.  Social History   Tobacco Use  . Smoking status: Former Smoker    Packs/day: 0.25    Years: 40.00    Pack years: 10.00    Types: Cigarettes    Last attempt to quit: 07/01/2017    Years since quitting: 0.0  . Smokeless tobacco: Never Used  . Tobacco comment: intermittent smoker over past 40 years, quit at time up to 6 months  Substance Use Topics  . Alcohol use: Never    Frequency: Never  . Drug use: Never    Review of Systems Per HPI unless specifically indicated above     Objective:    BP (!) 143/73 (BP Location: Right Arm, Patient Position: Sitting, Cuff Size: Normal)   Pulse 76   Ht 5\' 3"  (1.6 m)   Wt 190 lb (86.2 kg)   SpO2 99%   BMI 33.66 kg/m   Wt Readings from Last 3  Encounters:  07/29/17 190 lb (86.2 kg)  07/08/17 189 lb 6.4 oz (85.9 kg)  07/01/17 187 lb 9.6 oz (85.1 kg)    Physical Exam  Constitutional: She is oriented to person, place, and time. She appears well-developed and well-nourished. No distress.  HENT:  Head: Normocephalic and atraumatic.  Neck: Normal range of motion. Neck supple. Carotid bruit is not present.  Cardiovascular: Normal rate, regular rhythm, S1 normal, S2 normal, normal heart sounds and intact distal pulses.  Pulmonary/Chest: Effort normal and breath sounds normal. No respiratory distress.  Musculoskeletal: She exhibits no edema (pedal).  Neurological: She is alert and oriented to person, place, and time.  Skin: Skin is warm and dry.  Psychiatric: She has a normal mood and affect. Her behavior is normal.  Vitals reviewed.    Results for orders placed or performed in visit on 07/09/17  D-Dimer, Quantitative  Result Value Ref Range   D-Dimer, Quant 0.41 <0.50 mcg/mL FEU  Brain natriuretic peptide  Result Value Ref Range   Brain Natriuretic Peptide 15 <100 pg/mL      Assessment & Plan:   Problem List Items Addressed This Visit      Endocrine   Type 2 diabetes mellitus with peripheral neuropathy (HCC) Peripheral neuropathy stable.  Continue gabapentin.  Refill provided.  Followup with next scheduled appointment.   Relevant Medications   varenicline (CHANTIX STARTING MONTH PAK) 0.5 MG  X 11 & 1 MG X 42 tablet   varenicline (CHANTIX CONTINUING MONTH PAK) 1 MG tablet (Start on 08/26/2017)   cyclobenzaprine (FLEXERIL) 10 MG tablet   gabapentin (NEURONTIN) 300 MG capsule    Other Visit Diagnoses    Shortness of breath    -  Primary Current smoker Patient desires to stop smoking. Current shortness of breath may be related to exacerbation of reported asthma history or developing COPD.  Unlikely cause of chest pain, however.   - Consider spirometry at future visit after acute symptoms have resolved - START incruse 1  inhalation once daily - START albuterol inhaler 1-2 puffs every 6 hours prn wheezing or shortness of breath - Recommend smoking cessation.    -  7 days prior to quit date, START Chantix starting pack.  Take 0.5 mg tablet once daily w/ food for 3 days.  Take 0.5 mg tablet twice daily w/ food for 3 days.  Then, take 1 mg tablet twice daily and continue for total treatment w/ Chantix of 4-12 weeks.  Stop smoking or reduce by half on quit date. - Reviewed side effects of nausea, vivid dreams, depression, possible SI.   Relevant Medications  umeclidinium bromide (INCRUSE ELLIPTA) 62.5 MCG/INH AEPB  albuterol (PROVENTIL) inhaler   varenicline (CHANTIX STARTING MONTH PAK) 0.5 MG X 11 & 1 MG X 42 tablet  varenicline (CHANTIX CONTINUING MONTH PAK) 1 MG tablet (Start on 08/26/2017)     Other Relevant Orders   Ambulatory referral to Cardiology   ECHOCARDIOGRAM COMPLETE   Chest pain, unspecified type     Undiagnosed cause of chest pain.  Suspect is angina / cardiac origin.  - Referral cardiology - Dr. Ubaldo Glassing for easy transition toward [facility] with patient's planned move to [facility]. - Echocardiogram ordered.  Would consider stress test also, however will defer to Cardiology. Current doubt that patient will tolerate exercise stress test, - Followup for any change in symptoms and as scheduled.  Reviewed s/sx of MI and need for calling 9-1-1.   Relevant Orders   Ambulatory referral to Cardiology   ECHOCARDIOGRAM COMPLETE      Foot spasms     Stable today on exam.  Medications tolerated without side effects.  Continue at current doses.  Refills provided.   . Followup as scheduled.    Relevant Medications   cyclobenzaprine (FLEXERIL) 10 MG tablet   Neuropathy     Stable today on exam.  Medications tolerated without side effects.  Continue at current doses.  Refills provided. Followup as scheduled.    Relevant Medications   gabapentin (NEURONTIN) 300 MG capsule      Meds ordered this encounter    Medications  . umeclidinium bromide (INCRUSE ELLIPTA) 62.5 MCG/INH AEPB    Sig: Inhale 1 puff into the lungs daily.    Dispense:  30 each    Refill:  2    Order Specific Question:   Supervising Provider    Answer:   Olin Hauser [[id number]]  . DISCONTD: albuterol (PROVENTIL) (2.5 MG/3ML) 0.083% nebulizer solution    Sig: Take 3 mLs (2.5 mg total) by nebulization every 6 (six) hours as needed for wheezing or shortness of breath.    Dispense:  75 mL    Refill:  12    Order Specific Question:   Supervising Provider    Answer:   Olin Hauser [[id number]]  . varenicline (CHANTIX STARTING MONTH PAK) 0.5 MG X 11 & 1 MG X 42 tablet  Sig: Take one 0.5 mg tab by mouth once daily for 3 days, then take one 0.5 mg tab twice daily for 4 days, then take one 1 mg tab twice daily.    Dispense:  53 tablet    Refill:  0    Order Specific Question:   Supervising Provider    Answer:   Olin Hauser [[id number]]  . varenicline (CHANTIX CONTINUING MONTH PAK) 1 MG tablet    Sig: Take 1 tablet (1 mg total) by mouth 2 (two) times daily.    Dispense:  60 tablet    Refill:  1    Order Specific Question:   Supervising Provider    Answer:   Olin Hauser [[id number]]  . cyclobenzaprine (FLEXERIL) 10 MG tablet    Sig: Take 1 tablet (10 mg total) by mouth 3 (three) times daily as needed for muscle spasms.    Dispense:  30 tablet    Refill:  1    Order Specific Question:   Supervising Provider    Answer:   Olin Hauser [[id number]]  . gabapentin (NEURONTIN) 300 MG capsule    Sig: Take 1 capsule (300 mg total) by mouth 3 (three) times daily.    Dispense:  90 capsule    Refill:  5    Order Specific Question:   Supervising Provider    Answer:   Olin Hauser [[id number]]  . albuterol (PROVENTIL HFA;VENTOLIN HFA) 108 (90 Base) MCG/ACT inhaler    Sig: Inhale 1-2 puffs into the lungs every 6 (six) hours as needed for shortness of breath.    Dispense:  1 Inhaler    Refill:  2     Product selection permitted for insurance/patient brand preference. CANCEL nebulizer prescription.    Order Specific Question:   Supervising Provider    Answer:   Olin Hauser [[id number]]    Discussion today >3 minutes (<10 minutes) specifically on counseling on risks of tobacco use, complications, treatment, smoking cessation.   Follow up plan: Return in about 1 month (around 08/26/2017) for smoking cessation.  [name], DNP, AGPCNP-BC Adult Gerontology Primary Care Nurse Practitioner [facility] 07/29/2017, 3:55 PM

## 2017-07-30 ENCOUNTER — Encounter: Payer: Self-pay | Admitting: Nurse Practitioner

## 2017-07-30 MED ORDER — ALBUTEROL SULFATE HFA 108 (90 BASE) MCG/ACT IN AERS: 1.0000 | INHALATION_SPRAY | Freq: Four times a day (QID) | RESPIRATORY_TRACT | 2 refills | Status: DC | PRN

## 2017-07-31 ENCOUNTER — Telehealth: Payer: Self-pay

## 2017-07-31 NOTE — Telephone Encounter (Signed)
Echocardiogram scheduled for Wednesday, July 17th @ 11:00am. I attempted to contact the patient she was unable to document the appt, but ask that I call back and leave information on vm. I left a detail message on  the patient vm with appointment date and time.

## 2017-08-04 ENCOUNTER — Encounter: Payer: Self-pay | Admitting: Nurse Practitioner

## 2017-08-06 ENCOUNTER — Ambulatory Visit: Payer: Medicare HMO

## 2017-08-06 DIAGNOSIS — E1142 Type 2 diabetes mellitus with diabetic polyneuropathy: Secondary | ICD-10-CM | POA: Diagnosis not present

## 2017-08-06 DIAGNOSIS — R079 Chest pain, unspecified: Secondary | ICD-10-CM | POA: Diagnosis not present

## 2017-08-06 DIAGNOSIS — I10 Essential (primary) hypertension: Secondary | ICD-10-CM | POA: Diagnosis not present

## 2017-08-06 DIAGNOSIS — R69 Illness, unspecified: Secondary | ICD-10-CM | POA: Diagnosis not present

## 2017-08-06 DIAGNOSIS — R0602 Shortness of breath: Secondary | ICD-10-CM | POA: Diagnosis not present

## 2017-08-13 ENCOUNTER — Ambulatory Visit: Payer: Medicare HMO | Admitting: Cardiovascular Disease

## 2017-08-18 ENCOUNTER — Ambulatory Visit: Payer: Medicare HMO

## 2017-08-22 DIAGNOSIS — R079 Chest pain, unspecified: Secondary | ICD-10-CM | POA: Diagnosis not present

## 2017-08-25 ENCOUNTER — Ambulatory Visit: Payer: Medicare HMO | Admitting: Nurse Practitioner

## 2017-08-27 ENCOUNTER — Ambulatory Visit: Payer: Medicare HMO | Attending: Rehabilitative and Restorative Service Providers"

## 2017-08-27 ENCOUNTER — Other Ambulatory Visit: Payer: Self-pay | Admitting: Rehabilitative and Restorative Service Providers"

## 2017-08-27 DIAGNOSIS — R600 Localized edema: Secondary | ICD-10-CM

## 2017-08-27 DIAGNOSIS — I10 Essential (primary) hypertension: Secondary | ICD-10-CM | POA: Diagnosis not present

## 2017-08-27 DIAGNOSIS — E785 Hyperlipidemia, unspecified: Secondary | ICD-10-CM | POA: Diagnosis not present

## 2017-08-27 DIAGNOSIS — R69 Illness, unspecified: Secondary | ICD-10-CM | POA: Diagnosis not present

## 2017-08-27 DIAGNOSIS — E1142 Type 2 diabetes mellitus with diabetic polyneuropathy: Secondary | ICD-10-CM | POA: Diagnosis not present

## 2017-08-27 DIAGNOSIS — R0602 Shortness of breath: Secondary | ICD-10-CM | POA: Diagnosis not present

## 2017-08-27 DIAGNOSIS — R079 Chest pain, unspecified: Secondary | ICD-10-CM | POA: Diagnosis not present

## 2017-08-29 ENCOUNTER — Ambulatory Visit (INDEPENDENT_AMBULATORY_CARE_PROVIDER_SITE_OTHER): Payer: Medicare HMO | Admitting: Nurse Practitioner

## 2017-08-29 ENCOUNTER — Other Ambulatory Visit: Payer: Self-pay

## 2017-08-29 ENCOUNTER — Encounter: Payer: Self-pay | Admitting: Nurse Practitioner

## 2017-08-29 VITALS — BP 134/68 | HR 75 | Resp 18 | Ht 63.0 in | Wt 190.4 lb

## 2017-08-29 DIAGNOSIS — R0602 Shortness of breath: Secondary | ICD-10-CM | POA: Diagnosis not present

## 2017-08-29 DIAGNOSIS — R079 Chest pain, unspecified: Secondary | ICD-10-CM | POA: Diagnosis not present

## 2017-08-29 DIAGNOSIS — E1142 Type 2 diabetes mellitus with diabetic polyneuropathy: Secondary | ICD-10-CM | POA: Diagnosis not present

## 2017-08-29 DIAGNOSIS — Z716 Tobacco abuse counseling: Secondary | ICD-10-CM | POA: Diagnosis not present

## 2017-08-29 DIAGNOSIS — Z87891 Personal history of nicotine dependence: Secondary | ICD-10-CM | POA: Diagnosis not present

## 2017-08-29 DIAGNOSIS — I10 Essential (primary) hypertension: Secondary | ICD-10-CM | POA: Diagnosis not present

## 2017-08-29 NOTE — Patient Instructions (Addendum)
[place],   Thank you for coming in to clinic today.  1. Continue Chantix - take your evening dose about 1 hour earlier with your dinner.   2. No other med changes today.   3. Call if you have any more shortness of breath or chest pain or if not resolving.  Most of your shortness of breath was likely related to breathing.  Please schedule a follow-up appointment with [name], AGNP. Return in about 2 months (around 10/29/2017) for diabetes and smoking cessation.  If you have any other questions or concerns, please feel free to call the clinic or send a message through [place]. You may also schedule an earlier appointment if necessary.  You will receive a survey after today's visit either digitally by e-mail or paper by [employer]. Your experiences and feedback matter to [place].  Please respond so we know how we are doing as we provide care for you.   [name], DNP, AGNP-BC Adult Gerontology Nurse Practitioner [place]

## 2017-08-29 NOTE — Progress Notes (Signed)
Subjective:    Patient ID: Alison Mays, female    DOB: 05-27-1950, 67 y.o.   MRN: [id number]  Alison Mays is a 67 y.o. female presenting on 08/29/2017 for Nicotine Dependence   HPI Smoking Cessation Patient has been on chantix for 1 month.  She has been able to successfully quit smoking with a quit date of 08/12/2017.  She denies depression/SI.  She is having some insomnia.  Is taking second Chantix dose around 7-8 pm.  Eats dinner around 6:30 pm.  Is having cravings only after meals.  Other difficult times her mother has been a smoking.  She is not able to tell the smell of "stink" with smoke around her.  Exertional Chest pain and shortness of breath Patient has seen NP at Dr. Bethanne Ginger office for initial cardiac workup.  Since last visit and recently, shortness of breath and chest pain has only occurred with higher intensity exertion.  It last occurred with sweeping the floor.  Shortness of breath is continuing to improve with smoking cessation and use of Incruse.  Albuterol is being used infrequently.   Diabetes Patient has desire to stop metformin today.  Has concerns about it being bad for her.  Home blood sugars over last 3 days were 111 (today), 120 (yesterday), 220 (2 days ago) with "energy drink."  Over last month, CBG are regularly less than 120.  Range 111-220, but usually 120-130s.    Left leg swelling Plan to have [place] LLE as ordered by NP working with Dr. Ubaldo Glassing.  Patient concerned it could be a medication side effect.  No pain is noted except tenderness of LEFT lateral ankle with higher levels of swelling.  Social History   Tobacco Use  . Smoking status: Former Smoker    Packs/day: 0.25    Years: 40.00    Pack years: 10.00    Types: Cigarettes    Last attempt to quit: 07/01/2017    Years since quitting: 0.1  . Smokeless tobacco: Never Used  . Tobacco comment: intermittent smoker over past 40 years, quit at time up to 6 months  Substance Use Topics  . Alcohol use:  Never    Frequency: Never  . Drug use: Never    Review of Systems Per HPI unless specifically indicated above     Objective:    BP 134/68 (BP Location: Right Arm, Patient Position: Sitting, Cuff Size: Normal)   Pulse 75   Resp 18   Ht 5\' 3"  (1.6 m)   Wt 190 lb 6.4 oz (86.4 kg)   SpO2 99%   BMI 33.73 kg/m    Wt Readings from Last 3 Encounters:  07/29/17 190 lb (86.2 kg)  07/08/17 189 lb 6.4 oz (85.9 kg)  07/01/17 187 lb 9.6 oz (85.1 kg)    Physical Exam  Constitutional: She is oriented to person, place, and time. She appears well-developed and well-nourished. No distress.  HENT:  Head: Normocephalic and atraumatic.  Cardiovascular: Normal rate, regular rhythm, S1 normal, S2 normal, normal heart sounds and intact distal pulses.  Pulmonary/Chest: Effort normal and breath sounds normal. No respiratory distress.  Musculoskeletal: She exhibits edema (LLE +2 non-pitting edema; trace pedal edema RLE).  Neurological: She is alert and oriented to person, place, and time.  Skin: Skin is warm and dry.  Psychiatric: She has a normal mood and affect. Her behavior is normal.  Vitals reviewed.   Results for orders placed or performed in visit on 07/09/17  D-Dimer, Quantitative  Result  Value Ref Range   D-Dimer, Quant 0.41 <0.50 mcg/mL FEU  Brain natriuretic peptide  Result Value Ref Range   Brain Natriuretic Peptide 15 <100 pg/mL      Assessment & Plan:   Problem List Items Addressed This Visit      Cardiovascular and Mediastinum   Essential hypertension Stable today on exam.  Medications tolerated without side effects.  Continue at current doses.  Reviewed with patient is unlikely to have leg swelling as side effect of amlodipine as that would be affecting legs bilaterally. Followup 2 months.       Endocrine   Type 2 diabetes mellitus with peripheral neuropathy (HCC) Stable and improving based on home CBG readings.  Patient advised to continue metformin and glimepiride.   Verbalizes understanding.  Repeat A1c 2 months at followup.     Other   Former smoker Patient with full cessation of smoking over last 2-3 weeks.  She continues to have cravings after meals.  Tolerates chantix without significant side effects.  Is having mild insomnia. - Continue Chantix continuing packs for next 2 months.   Take evening dose before or at start of evening meal.   - Followup 2 months.    Other Visit Diagnoses    Encounter for smoking cessation counseling    -  Primary See AP former smoker above.    Exertional shortness of breath     Likely related to combination of lung disease improved on Incruse and albuterol and tricuspid valvular disease.  Continue to optimize blood pressure, continue smoking cessation, continue optimal management of lipids and glucose.    Exertional chest pain     Continues, but is less severe and less frequent than 4 weeks ago. 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  Reviewed emergency signs and symptoms.  Continue followup with Dr. Bethanne Ginger clinic.        Follow up plan: Return in about 2 months (around 10/29/2017) for diabetes and smoking cessation.  [name], DNP, AGPCNP-BC Adult Gerontology Primary Care Nurse Practitioner [facility] 08/29/2017, 1:24 PM

## 2017-09-08 DIAGNOSIS — R69 Illness, unspecified: Secondary | ICD-10-CM | POA: Diagnosis not present

## 2017-09-14 ENCOUNTER — Other Ambulatory Visit: Payer: Self-pay | Admitting: Nurse Practitioner

## 2017-09-14 DIAGNOSIS — R252 Cramp and spasm: Secondary | ICD-10-CM

## 2017-09-18 ENCOUNTER — Other Ambulatory Visit: Payer: Self-pay | Admitting: Nurse Practitioner

## 2017-09-18 DIAGNOSIS — R252 Cramp and spasm: Secondary | ICD-10-CM

## 2017-09-18 NOTE — Telephone Encounter (Signed)
Pt needs a refill on flexeril sent to [facility] in [place].  Call back [phone]

## 2017-09-19 MED ORDER — CYCLOBENZAPRINE HCL 10 MG PO TABS: 10.0000 mg | ORAL_TABLET | Freq: Three times a day (TID) | ORAL | 1 refills | Status: DC | PRN

## 2017-10-01 ENCOUNTER — Encounter: Payer: Self-pay | Admitting: Nurse Practitioner

## 2017-10-11 ENCOUNTER — Other Ambulatory Visit: Payer: Self-pay | Admitting: Nurse Practitioner

## 2017-10-11 DIAGNOSIS — R0602 Shortness of breath: Secondary | ICD-10-CM

## 2017-10-14 ENCOUNTER — Other Ambulatory Visit: Payer: Self-pay | Admitting: Nurse Practitioner

## 2017-10-14 DIAGNOSIS — E1142 Type 2 diabetes mellitus with diabetic polyneuropathy: Secondary | ICD-10-CM

## 2017-10-22 ENCOUNTER — Other Ambulatory Visit: Payer: Self-pay | Admitting: Nurse Practitioner

## 2017-10-22 DIAGNOSIS — F17200 Nicotine dependence, unspecified, uncomplicated: Secondary | ICD-10-CM

## 2017-10-22 DIAGNOSIS — R0602 Shortness of breath: Secondary | ICD-10-CM

## 2017-10-23 ENCOUNTER — Ambulatory Visit (INDEPENDENT_AMBULATORY_CARE_PROVIDER_SITE_OTHER): Payer: Medicare Other | Admitting: Nurse Practitioner

## 2017-10-23 ENCOUNTER — Encounter: Payer: Self-pay | Admitting: Nurse Practitioner

## 2017-10-23 ENCOUNTER — Other Ambulatory Visit: Payer: Self-pay

## 2017-10-23 VITALS — BP 162/89 | HR 79 | Temp 98.3°F | Ht 63.0 in | Wt 186.2 lb

## 2017-10-23 DIAGNOSIS — J208 Acute bronchitis due to other specified organisms: Secondary | ICD-10-CM | POA: Diagnosis not present

## 2017-10-23 DIAGNOSIS — J00 Acute nasopharyngitis [common cold]: Secondary | ICD-10-CM | POA: Diagnosis not present

## 2017-10-23 MED ORDER — IPRATROPIUM BROMIDE 0.06 % NA SOLN: 2.0000 | Freq: Four times a day (QID) | NASAL | 0 refills | Status: DC

## 2017-10-23 MED ORDER — BENZONATATE 100 MG PO CAPS: 100.0000 mg | ORAL_CAPSULE | Freq: Two times a day (BID) | ORAL | 0 refills | Status: DC | PRN

## 2017-10-23 NOTE — Progress Notes (Signed)
Subjective:    Patient ID: Alison Mays, female    DOB: August 18, 1950, 67 y.o.   MRN: [id number]  Alison Mays is a 67 y.o. female presenting on 10/23/2017 for Cough (sore throat, runny nose, head congestion and chest congestion x 2 days )   HPI URI symptoms Patient presents today with symptoms of URI x 2 days.  She has known sick contact - granddaughter who started getting sick Thursday. Patient started getting sick Tuesday Cough is most bothersome as it is preventing sleep.  Patient also has nasal congestion, sinus congestion, sore throat, Mild trouble breathing last night.  No ear symptoms, fever, chills, or sweats, nausea, vomiting, or diarrhea. She has not taken any OTC meds for symptoms to date.  Social History   Tobacco Use  . Smoking status: Former Smoker    Packs/day: 0.25    Years: 40.00    Pack years: 10.00    Types: Cigarettes    Last attempt to quit: 08/17/2017    Years since quitting: 0.1  . Smokeless tobacco: Never Used  . Tobacco comment: intermittent smoker over past 40 years, quit at time up to 6 months  Substance Use Topics  . Alcohol use: Never    Frequency: Never  . Drug use: Never    Review of Systems Per HPI unless specifically indicated above     Objective:    BP (!) 162/89 (BP Location: Right Arm, Patient Position: Sitting, Cuff Size: Normal)   Pulse 79   Temp 98.3 F (36.8 C) (Oral)   Ht 5\' 3"  (1.6 m)   Wt 186 lb 3.2 oz (84.5 kg)   BMI 32.98 kg/m   Wt Readings from Last 3 Encounters:  10/23/17 186 lb 3.2 oz (84.5 kg)  08/29/17 190 lb 6.4 oz (86.4 kg)  07/29/17 190 lb (86.2 kg)    Physical Exam  Constitutional: She appears well-developed and well-nourished.  HENT:  Head: Normocephalic and atraumatic.  Right Ear: Hearing, tympanic membrane, external ear and ear canal normal.  Left Ear: Hearing, tympanic membrane, external ear and ear canal normal.  Nose: Mucosal edema and rhinorrhea present. Right sinus exhibits maxillary sinus  tenderness and frontal sinus tenderness. Left sinus exhibits maxillary sinus tenderness and frontal sinus tenderness.  Mouth/Throat: Uvula is midline and mucous membranes are normal. Posterior oropharyngeal erythema (mildly injected) present. No oropharyngeal exudate or posterior oropharyngeal edema. Tonsils are 0 on the right. Tonsils are 0 on the left.  Eyes: Pupils are equal, round, and reactive to light. Conjunctivae and EOM are normal. Right eye exhibits no discharge. Left eye exhibits no discharge.  Neck: Normal range of motion. Neck supple.  Cardiovascular: Normal rate, regular rhythm, S1 normal, S2 normal, normal heart sounds and intact distal pulses.  Pulmonary/Chest: Effort normal. No respiratory distress. She has wheezes. She has no rales.  Lymphadenopathy:    She has cervical adenopathy.  Neurological: She is alert.  Skin: Skin is warm and dry. Capillary refill takes less than 2 seconds.  Psychiatric: She has a normal mood and affect. Her behavior is normal.  Vitals reviewed.  Results for orders placed or performed in visit on 10/01/17  HM MAMMOGRAPHY  Result Value Ref Range   HM Mammogram 0-4 Bi-Rad 0-4 Bi-Rad, Self Reported Normal  Hemoglobin A1c  Result Value Ref Range   Hemoglobin A1C 6.3 (A) 4.0 - 5.6 %  Lipid panel  Result Value Ref Range   Triglycerides 136 40 - 160   Cholesterol 169 0 - 200  HDL 36 35 - 70   LDL Cholesterol 108       Assessment & Plan:   Problem List Items Addressed This Visit    None    Visit Diagnoses    Viral bronchitis    -  Primary   Acute nasopharyngitis (common cold)        Acute illness. Fever responsive to NSAIDs and tylenol.  Symptoms continued to worsening with some difficulty breathing. Consistent with viral illness and bronchitis x 2 days with known sick contacts and no identifiable focal infections of ears, nose, throat.  Plan: 1. Reassurance, likely self-limited with cough lasting up to few weeks - Start Atrovent nasal  spray decongestant 2 sprays each nostril up to 4 times daily for 5-7 days - Start anti-histamine Loratadine or Cetirizine 10mg  daily,  - also can use Flonase 2 sprays each nostril daily for up to 4-6 weeks - Start Mucinex-DM OTC up to 7-10 days then stop - Start tessalon perles 100 mg every 12 hours as needed for cough. 2. Supportive care with nasal saline, warm herbal tea with honey, 3. Improve hydration 4. Tylenol / Motrin PRN fevers 5. Return criteria given   Meds ordered this encounter  Medications  . benzonatate (TESSALON) 100 MG capsule    Sig: Take 1 capsule (100 mg total) by mouth 2 (two) times daily as needed for cough.    Dispense:  20 capsule    Refill:  0    Order Specific Question:   Supervising Provider    Answer:   Olin Hauser [[id number]]  . ipratropium (ATROVENT) 0.06 % nasal spray    Sig: Place 2 sprays into both nostrils 4 (four) times daily for 5 days.    Dispense:  15 mL    Refill:  0    Order Specific Question:   Supervising Provider    Answer:   Olin Hauser [[id number]]    Follow up plan: Return 7-10 days if symptoms worsen or fail to improve.  [name], DNP, AGPCNP-BC Adult Gerontology Primary Care Nurse Practitioner [facility] 10/23/2017, 3:24 PM

## 2017-10-23 NOTE — Patient Instructions (Addendum)
[place],   Thank you for coming in to clinic today.  1. It sounds like you have a Upper Respiratory Virus - this will most likely run it's course in 7 to 10 days. Recommend good hand washing. - Start Atrovent nasal spray decongestant 2 sprays each nostril up to 4 times daily for 5-7 days - If congestion is worse, start OTC Mucinex (or may try Mucinex-DM for cough) up to 7-10 days then stop - Drink plenty of fluids to improve congestion - You may try over the counter Nasal Saline spray (Simply Saline, Ocean Spray) as needed to reduce congestion. - Drink warm herbal tea with honey for sore throat. - Start taking Tylenol extra strength 1 to 2 tablets every 6-8 hours for aches or fever/chills for next few days as needed.  Do not take more than 3,000 mg in 24 hours from all medicines.  May take Ibuprofen as well if tolerated 200-400mg  every 8 hours as needed.  - You can start tessalon perles 100 mg one tablet twice daily for 10 days prn. - START using albuterol regularly about 3-4 times daily and up to 2-3 more times as needed for wheezing for a total of 6 times daily.  If symptoms significantly worsening with persistent fevers/chills despite tylenol/ibpurofen, nausea, vomiting unable to tolerate food/fluids or medicine, body aches, or shortness of breath, sinus pain pressure or worsening productive cough, then follow-up for re-evaluation, may seek more immediate care at Urgent Care or ED if more concerned for emergency.  Please schedule a follow-up appointment with [name], AGNP. Return 7-10 days if symptoms worsen or fail to improve.  Call if getting better, then worse; getting fevers/chills/sweats; or if symptoms last 10 days.  If you have any other questions or concerns, please feel free to call the clinic or send a message through [place]. You may also schedule an earlier appointment if necessary.  You will receive a survey after today's visit either digitally by e-mail or paper  by [employer]. Your experiences and feedback matter to [place].  Please respond so we know how we are doing as we provide care for you.   [name], DNP, AGNP-BC Adult Gerontology Nurse Practitioner [place]

## 2017-10-30 ENCOUNTER — Ambulatory Visit: Payer: Medicare HMO | Admitting: Nurse Practitioner

## 2017-11-14 ENCOUNTER — Other Ambulatory Visit: Payer: Self-pay | Admitting: Nurse Practitioner

## 2017-11-14 DIAGNOSIS — R252 Cramp and spasm: Secondary | ICD-10-CM

## 2017-11-28 ENCOUNTER — Telehealth: Payer: Self-pay | Admitting: Nurse Practitioner

## 2017-11-28 ENCOUNTER — Ambulatory Visit (INDEPENDENT_AMBULATORY_CARE_PROVIDER_SITE_OTHER): Payer: Medicare Other | Admitting: Nurse Practitioner

## 2017-11-28 VITALS — BP 140/80 | HR 76 | Resp 15 | Ht 63.0 in | Wt 191.4 lb

## 2017-11-28 DIAGNOSIS — I10 Essential (primary) hypertension: Secondary | ICD-10-CM

## 2017-11-28 DIAGNOSIS — E1142 Type 2 diabetes mellitus with diabetic polyneuropathy: Secondary | ICD-10-CM | POA: Diagnosis not present

## 2017-11-28 DIAGNOSIS — R252 Cramp and spasm: Secondary | ICD-10-CM

## 2017-11-28 DIAGNOSIS — M25511 Pain in right shoulder: Secondary | ICD-10-CM

## 2017-11-28 DIAGNOSIS — E785 Hyperlipidemia, unspecified: Secondary | ICD-10-CM | POA: Diagnosis not present

## 2017-11-28 LAB — POCT GLYCOSYLATED HEMOGLOBIN (HGB A1C): Hemoglobin A1C: 6.9 % — AB (ref 4.0–5.6)

## 2017-11-28 MED ORDER — METOPROLOL TARTRATE 50 MG PO TABS: 50.0000 mg | ORAL_TABLET | Freq: Two times a day (BID) | ORAL | 5 refills | Status: DC

## 2017-11-28 MED ORDER — AMLODIPINE BESYLATE 5 MG PO TABS: 5.0000 mg | ORAL_TABLET | Freq: Every day | ORAL | 1 refills | Status: DC

## 2017-11-28 MED ORDER — CYCLOBENZAPRINE HCL 10 MG PO TABS: ORAL_TABLET | ORAL | 0 refills | Status: DC

## 2017-11-28 NOTE — Progress Notes (Signed)
Subjective:    Patient ID: Alison Mays, female    DOB: Oct 14, 1950, 67 y.o.   MRN: [id number]  JAMILETTE Mays is a 67 y.o. female presenting on 11/28/2017 for Diabetes; Back Pain (Follow up); and Shoulder Pain   HPI Diabetes Pt presents today for follow up of Type 2 diabetes mellitus. She is checking fasting am CBG at home with a range of 76-130, usually 110-120 - Current diabetic medications include: metformin XR 750 mg bid and glipizide 10 mg bid.  Metformin causes patient nausea despite eating and XR formula. - She is symptomatic with numbness in feet, but notes pain has resolved..  - She denies polydipsia, polyphagia, polyuria, headaches, diaphoresis, shakiness, chills and changes in vision.   - Clinical course has been stable. - She  reports no regular exercise routine, but keeps active lifestyle with 25 year old granddaughter (primary caregiver). - Her diet is moderate in salt, moderate in fat, and moderate in carbohydrates. - Weight trend: stable  PREVENTION: Eye exam current (within one year): no Foot exam current (within one year): yes  Lipid/ASCVD risk reduction - on statin: atorvastatin Kidney protection - on ace or arb: losartan Recent Labs    02/04/17 05/22/17 1037 11/28/17 1001  HGBA1C 6.3* 7.4% 6.9*   Lipid Patient is taking atorvastatin and tolerating it well.  Last lipid panel 05/2017.   - Pt denies changes in vision, chest tightness/pressure, palpitations, shortness of breath, leg pain while walking, leg or arm weakness, and sudden loss of speech or loss of consciousness.  - No new or prior ASCVD events.  Hair loss  Patient is losing hair in center of head - so stopped a med - Not sure which one (25 mg), but states it was probably not chlorthalidone.  She will call after visit to review.  Shoulder pain Neck/upper back, shoulder pain.  Sleeps with pillows up.  When coughs feels this pain in the shoulder joint.  Posterior and anterior shoulder pain are most  prominent.  No known trauma or injury.  Pain onset was about 3 weeks ago.  Pain is only minimally limiting patient's activity and has no significant difficulty with raising arm up to groom head, brush teeth.   Social History   Tobacco Use  . Smoking status: Former Smoker    Packs/day: 0.25    Years: 40.00    Pack years: 10.00    Types: Cigarettes    Last attempt to quit: 08/17/2017    Years since quitting: 0.2  . Smokeless tobacco: Never Used  . Tobacco comment: intermittent smoker over past 40 years, quit at time up to 6 months  Substance Use Topics  . Alcohol use: Never    Frequency: Never  . Drug use: Never    Review of Systems Per HPI unless specifically indicated above     Objective:    BP 140/80 (BP Location: Left Arm, Patient Position: Sitting, Cuff Size: Normal)   Pulse 76   Resp 15   Ht 5\' 3"  (1.6 m)   Wt 191 lb 6.4 oz (86.8 kg)   SpO2 99%   BMI 33.90 kg/m   Wt Readings from Last 3 Encounters:  11/28/17 191 lb 6.4 oz (86.8 kg)  10/23/17 186 lb 3.2 oz (84.5 kg)  08/29/17 190 lb 6.4 oz (86.4 kg)    Physical Exam  Constitutional: She is oriented to person, place, and time. She appears well-developed and well-nourished. No distress.  HENT:  Head: Normocephalic and atraumatic.  Eyes:  Pupils are equal, round, and reactive to light. EOM are normal.  Neck: Normal range of motion. Neck supple.  Cardiovascular: Normal rate, regular rhythm, S1 normal, S2 normal, normal heart sounds and intact distal pulses.  Pulmonary/Chest: Effort normal and breath sounds normal. No respiratory distress.  Musculoskeletal:  RIGHT Shoulder Inspection: Normal appearance bilateral symmetrical Palpation: Tender to palpation over anterior, lateral, and posterior shoulder  ROM: AROM forward flexion, abduction, internal / external rotation slightly limited in all directions r/t pain with movement. Special Testing: Rotator cuff testing negative for weakness with supraspinatus full can and  empty can test, O'brien's negative for labral pain, Hawkin's AC impingement positive for pain Strength: Normal strength 5/5 flex/ext, ext rot / int rot, grip, rotator cuff str testing. Neurovascular: Distally intact pulses, sensation to light touch.  Neurological: She is alert and oriented to person, place, and time.  Skin: Skin is warm and dry. Capillary refill takes less than 2 seconds.  Psychiatric: She has a normal mood and affect. Her behavior is normal. Judgment and thought content normal.  Vitals reviewed.   Results for orders placed or performed in visit on 10/01/17  HM MAMMOGRAPHY  Result Value Ref Range   HM Mammogram 0-4 Bi-Rad 0-4 Bi-Rad, Self Reported Normal  Hemoglobin A1c  Result Value Ref Range   Hemoglobin A1C 6.3 (A) 4.0 - 5.6 %  Lipid panel  Result Value Ref Range   Triglycerides 136 40 - 160   Cholesterol 169 0 - 200   HDL 36 35 - 70   LDL Cholesterol 108       Assessment & Plan:   Problem List Items Addressed This Visit      Cardiovascular and Mediastinum   Essential hypertension    Improved, but persistently unontrolled hypertension.  BP goal < 130/80.  Plan: 1. Continue taking metoprolol, losartan, chlorthalidone - START amlodipine 5 mg once daily -  Pt had hair loss with Triamterene-HCTZ in past and has started having this again.  She may have stopped chlorthalidone and will need to have additional med with amlodipine.  Increase as needed to reach goal. 2. Obtain labs CMP, lipid 3. Encouraged heart healthy diet and increasing exercise to 30 minutes most days of the week. 4. Check BP 1-2 x per week at home, keep log, and bring to clinic at next appointment. 5. Follow up 3 months.        Relevant Medications   amLODipine (NORVASC) 5 MG tablet   Other Relevant Orders   COMPLETE METABOLIC PANEL WITH GFR     Endocrine   Type 2 diabetes mellitus with peripheral neuropathy (HCC) - Primary    ControlledDM with A1c 6.9% improved from 7.4% and goal A1c <  7.0%. - Complications - hyperlipidemia and peripheral neuropathy.  Plan:  1. Continue current therapy:  metformin, glipizide 2. Encourage improved lifestyle: - low carb/low glycemic diet reinforced prior education - Increase physical activity to 30 minutes most days of the week.  Explained that increased physical activity increases body's use of sugar for energy. 3. Continue ARB and Statin 4. Advised to schedule DM ophtho exam, send record. 5. Follow-up 3 months       Relevant Medications   cyclobenzaprine (FLEXERIL) 10 MG tablet   Other Relevant Orders   Lipid panel   COMPLETE METABOLIC PANEL WITH GFR   POCT glycosylated hemoglobin (Hb A1C) (Completed)     Other   Hyperlipidemia    Stable today on exam without new ASCVD events. LDL goal <  70 with last reading = 89.  Medications tolerated without side effects.  Continue at current doses.  If LDL not at goal consider Zetia.  Recommended ongoing lifestyle improvement.  Refills provided.  Check labs today. Followup 3 months.       Relevant Medications   amLODipine (NORVASC) 5 MG tablet   Other Relevant Orders   Lipid panel   COMPLETE METABOLIC PANEL WITH GFR    Other Visit Diagnoses    Acute pain of right shoulder     Pain likely self-limited and related to rotator cuff strain.  Plan:  1. Treat with OTC pain meds (acetaminophen and ibuprofen).  Discussed alternate dosing and max dosing. 2. Apply heat and/or ice to affected area. 3. May also apply a muscle rub with lidocaine or lidocaine patch after heat or ice. 4. Take muscle relaxer cyclobenzaprine 10 mg up to three times daily.  Cautioned drowsiness. 5. Start ROM exercises and consider physical therapy.  [facility] order provided. 6. Follow up 2-4 weeks prn.  Consider orthopedic referral if not improving.       Meds ordered this encounter  Medications  . amLODipine (NORVASC) 5 MG tablet    Sig: Take 1 tablet (5 mg total) by mouth daily.    Dispense:  90 tablet     Refill:  1    Order Specific Question:   Supervising Provider    Answer:   Olin Hauser [[id number]]  . cyclobenzaprine (FLEXERIL) 10 MG tablet    Sig: TAKE 1 TABLET(10 MG) BY MOUTH THREE TIMES DAILY AS NEEDED FOR MUSCLE SPASMS    Dispense:  30 tablet    Refill:  0    Order Specific Question:   Supervising Provider    Answer:   Olin Hauser [[id number]]    Follow up plan: Return in about 3 months (around 02/28/2018) for diabetes, hypertension.  [name], DNP, AGPCNP-BC Adult Gerontology Primary Care Nurse Practitioner [facility] 11/28/2017, 9:22 AM

## 2017-11-28 NOTE — Telephone Encounter (Signed)
Pt called requesting refill on metoprolol tartrate 50 mg called into [facility]

## 2017-11-28 NOTE — Patient Instructions (Addendum)
[facility],   Thank you for coming in to clinic today.  1. For shoulder pain. You have a rotator cuff strain. - Take 220 mg Aleeve twice daily for 14 days. - Start taking Tylenol extra strength 1 to 2 tablets every 6-8 hours for aches or fever/chills for next few days as needed.  Do not take more than 3,000 mg in 24 hours from all medicines.  May take both Aleeve and Tylenol in same day. - Use heat and ice.  Apply this for 15 minutes at a time 6-8 times per day.   - Muscle rub with lidocaine, lidocaine patch, Biofreeze, or tiger balm for topical pain relief.  Avoid using this with heat and ice to avoid burns. - START muscle relaxer baclofen 10 mg one tablet up to three times daily.  This can cause drowsiness, so use caution.  It may be best to only take this at night for helping you during sleep.  - START physical therapy any time in next 1-3 weeks. - START range of motion exercises at home.  2. Diabetes: - Take glipizide WITH MEALS - Take metformin 2 pills at one dose when you have least nausea.  - Keep working on reducing sugars and refined carbs (crackers).  You will be due for [place].  This means you should eat no food or drink after midnight.  Drink only water or coffee without cream/sugar on the morning of your lab visit. - Please go ahead and schedule a "Lab Only" visit in the morning at the clinic for lab draw in the next 7 days. - Your results will be available about 2-3 days after blood draw.  If you have set up a MyChart account, you can can log in to MyChart online to view your results and a brief explanation. Also, we can discuss your results together at your next office visit if you would like.   3. Blood pressure: Losartan- continue at 100 mg daily Amlodipine - REDUCE to 5 mg daily Chlorthalidone - Continue 25 mg once daily in am   Please schedule a follow-up appointment with [name], AGNP. Return in about 3 months (around 02/28/2018) for diabetes,  hypertension.  If you have any other questions or concerns, please feel free to call the clinic or send a message through [facility]. You may also schedule an earlier appointment if necessary.  You will receive a survey after today's visit either digitally by e-mail or paper by [employer]. Your experiences and feedback matter to [place].  Please respond so we know how we are doing as we provide care for you.   [name], DNP, AGNP-BC Adult Gerontology Nurse Practitioner [facility], [facility]   Shoulder Range of Motion Exercises Shoulder range of motion (ROM) exercises are designed to keep the shoulder moving freely. They are often recommended for people who have shoulder pain. Phase 1 exercises When you are able, do this exercise 5-6 days per week, or as told by your health care provider. Work toward doing 2 sets of 10 swings. Pendulum Exercise How To Do This Exercise Lying Down 1. Lie face-down on a bed with your abdomen close to the side of the bed. 2. Let your arm hang over the side of the bed. 3. Relax your shoulder, arm, and hand. 4. Slowly and gently swing your arm forward and back. Do not use your neck muscles to swing your arm. They should be relaxed. If you are struggling to swing your  arm, have someone gently swing it for you. When you do this exercise for the first time, swing your arm at a 15 degree angle for 15 seconds, or swing your arm 10 times. As pain lessens over time, increase the angle of the swing to 30-45 degrees. 5. Repeat steps 1-4 with the other arm.  How To Do This Exercise While Standing 1. Stand next to a sturdy chair or table and hold on to it with your hand. 1. Bend forward at the waist. 2. Bend your knees slightly. 3. Relax your other arm and let it hang limp. 4. Relax the shoulder blade of the arm that is hanging and let it drop. 5. While keeping your shoulder relaxed, use body motion to swing your arm in small circles. The first time you do this  exercise, swing your arm for about 30 seconds or 10 times. When you do it next time, swing your arm for a little longer. 6. Stand up tall and relax. 7. Repeat steps 1-7, this time changing the direction of the circles. 2. Repeat steps 1-8 with the other arm.  Phase 2 exercises Do these exercises 3-4 times per day on 5-6 days per week or as told by your health care provider. Work toward holding the stretch for 20 seconds. Stretching Exercise 1 1. Lift your arm straight out in front of you. 2. Bend your arm 90 degrees at the elbow (right angle) so your forearm goes across your body and looks like the letter "L." 3. Use your other arm to gently pull the elbow forward and across your body. 4. Repeat steps 1-3 with the other arm. Stretching Exercise 2 You will need a towel or rope for this exercise. 1. Bend one arm behind your back with the palm facing outward. 2. Hold a towel with your other hand. 3. Reach the arm that holds the towel above your head, and bend that arm at the elbow. Your wrist should be behind your neck. 4. Use your free hand to grab the free end of the towel. 5. With the higher hand, gently pull the towel up behind you. 6. With the lower hand, pull the towel down behind you. 7. Repeat steps 1-6 with the other arm.  Phase 3 exercises Do each of these exercises at four different times of day (sessions) every day or as told by your health care provider. To begin with, repeat each exercise 5 times (repetitions). Work toward doing 3 sets of 12 repetitions or as told by your health care provider. Strengthening Exercise 1 You will need a light weight for this activity. As you grow stronger, you may use a heavier weight. 1. Standing with a weight in your hand, lift your arm straight out to the side until it is at the same height as your shoulder. 2. Bend your arm at 90 degrees so that your fingers are pointing to the ceiling. 3. Slowly raise your hand until your arm is straight up  in the air. 4. Repeat steps 1-3 with the other arm.  Strengthening Exercise 2 You will need a light weight for this activity. As you grow stronger, you may use a heavier weight. 1. Standing with a weight in your hand, gradually move your straight arm in an arc, starting at your side, then out in front of you, then straight up over your head. 2. Gradually move your other arm in an arc, starting at your side, then out in front of you, then straight up over  your head. 3. Repeat steps 1-2 with the other arm.  Strengthening Exercise 3 You will need an elastic band for this activity. As you grow stronger, gradually increase the size of the bands or increase the number of bands that you use at one time. 1. While standing, hold an elastic band in one hand and raise that arm up in the air. 2. With your other hand, pull down the band until that hand is by your side. 3. Repeat steps 1-2 with the other arm.  This information is not intended to replace advice given to you by your health care provider. Make sure you discuss any questions you have with your health care provider. Document Released: 10/06/2002 Document Revised: 09/03/2015 Document Reviewed: 01/03/2014 Elsevier Interactive Patient Education  [employer].

## 2017-11-30 ENCOUNTER — Encounter: Payer: Self-pay | Admitting: Nurse Practitioner

## 2017-11-30 NOTE — Assessment & Plan Note (Signed)
Improved, but persistently unontrolled hypertension.  BP goal < 130/80.  Plan: 1. Continue taking metoprolol, losartan, chlorthalidone - START amlodipine 5 mg once daily -  Pt had hair loss with Triamterene-HCTZ in past and has started having this again.  She may have stopped chlorthalidone and will need to have additional med with amlodipine.  Increase as needed to reach goal. 2. Obtain labs CMP, lipid 3. Encouraged heart healthy diet and increasing exercise to 30 minutes most days of the week. 4. Check BP 1-2 x per week at home, keep log, and bring to clinic at next appointment. 5. Follow up 3 months.

## 2017-11-30 NOTE — Assessment & Plan Note (Signed)
ControlledDM with A1c 6.9% improved from 7.4% and goal A1c < 7.0%. - Complications - hyperlipidemia and peripheral neuropathy.  Plan:  1. Continue current therapy:  metformin, glipizide 2. Encourage improved lifestyle: - low carb/low glycemic diet reinforced prior education - Increase physical activity to 30 minutes most days of the week.  Explained that increased physical activity increases body's use of sugar for energy. 3. Continue ARB and Statin 4. Advised to schedule DM ophtho exam, send record. 5. Follow-up 3 months

## 2017-11-30 NOTE — Assessment & Plan Note (Signed)
Stable today on exam without new ASCVD events. LDL goal < 70 with last reading = 89.  Medications tolerated without side effects.  Continue at current doses.  If LDL not at goal consider Zetia.  Recommended ongoing lifestyle improvement.  Refills provided.  Check labs today. Followup 3 months.

## 2017-12-01 ENCOUNTER — Other Ambulatory Visit: Payer: Medicare Other

## 2017-12-03 LAB — COMPLETE METABOLIC PANEL WITH GFR
AG Ratio: 1.6 (calc) (ref 1.0–2.5)
ALT: 16 U/L (ref 6–29)
AST: 18 U/L (ref 10–35)
Albumin: 4.3 g/dL (ref 3.6–5.1)
Alkaline phosphatase (APISO): 96 U/L (ref 33–130)
BUN: 16 mg/dL (ref 7–25)
CO2: 23 mmol/L (ref 20–32)
Calcium: 9.8 mg/dL (ref 8.6–10.4)
Chloride: 103 mmol/L (ref 98–110)
Creat: 0.85 mg/dL (ref 0.50–0.99)
GFR, Est African American: 83 mL/min/{1.73_m2} (ref >=60)
GFR, Est Non African American: 71 mL/min/{1.73_m2} (ref >=60)
Globulin: 2.7 g/dL (calc) (ref 1.9–3.7)
Glucose, Bld: 113 mg/dL — ABNORMAL HIGH (ref 65–99)
Potassium: 4.2 mmol/L (ref 3.5–5.3)
Sodium: 138 mmol/L (ref 135–146)
Total Bilirubin: 0.6 mg/dL (ref 0.2–1.2)
Total Protein: 7 g/dL (ref 6.1–8.1)

## 2017-12-03 LAB — LIPID PANEL
Cholesterol: 140 mg/dL (ref <200)
HDL: 33 mg/dL — ABNORMAL LOW (ref >50)
LDL Cholesterol (Calc): 85 mg/dL (calc)
Non-HDL Cholesterol (Calc): 107 mg/dL (calc) (ref <130)
Total CHOL/HDL Ratio: 4.2 (calc) (ref <5.0)
Triglycerides: 120 mg/dL (ref <150)

## 2017-12-09 ENCOUNTER — Other Ambulatory Visit: Payer: Self-pay | Admitting: Nurse Practitioner

## 2017-12-09 DIAGNOSIS — E785 Hyperlipidemia, unspecified: Secondary | ICD-10-CM

## 2017-12-09 DIAGNOSIS — E782 Mixed hyperlipidemia: Secondary | ICD-10-CM

## 2017-12-09 MED ORDER — ROSUVASTATIN CALCIUM 40 MG PO TABS: 40.0000 mg | ORAL_TABLET | Freq: Every day | ORAL | 1 refills | Status: DC

## 2017-12-10 LAB — COLOGUARD: Cologuard: POSITIVE

## 2017-12-13 ENCOUNTER — Encounter: Payer: Self-pay | Admitting: Nurse Practitioner

## 2017-12-17 ENCOUNTER — Telehealth: Payer: Self-pay | Admitting: Nurse Practitioner

## 2017-12-17 ENCOUNTER — Other Ambulatory Visit: Payer: Self-pay

## 2017-12-17 DIAGNOSIS — R195 Other fecal abnormalities: Secondary | ICD-10-CM

## 2017-12-17 NOTE — Telephone Encounter (Signed)
Called patient without answer.  LM to return call to [facility] today.  Patient with positive cologuard screening will need colonoscopy.

## 2017-12-17 NOTE — Telephone Encounter (Signed)
Patient returned call and informed her of results.  She will proceed with testing via colonscopy.   - Admits she has occasionally "dark" bowel movements.

## 2018-01-05 ENCOUNTER — Ambulatory Visit
Admission: RE | Admit: 2018-01-05 | Discharge: 2018-01-05 | Disposition: A | Discharge status: Home / Self Care | Payer: Medicare Other | Attending: Gastroenterology | Admitting: Gastroenterology

## 2018-01-05 ENCOUNTER — Encounter: Payer: Self-pay | Admitting: *Deleted

## 2018-01-05 ENCOUNTER — Ambulatory Visit: Payer: Medicare Other | Admitting: Registered Nurse

## 2018-01-05 ENCOUNTER — Encounter
Admission: RE | Disposition: A | Discharge status: Home / Self Care | Payer: Self-pay | Source: Home / Self Care | Attending: Gastroenterology

## 2018-01-05 ENCOUNTER — Other Ambulatory Visit: Payer: Self-pay

## 2018-01-05 DIAGNOSIS — E785 Hyperlipidemia, unspecified: Secondary | ICD-10-CM | POA: Diagnosis not present

## 2018-01-05 DIAGNOSIS — D124 Benign neoplasm of descending colon: Secondary | ICD-10-CM

## 2018-01-05 DIAGNOSIS — Z7984 Long term (current) use of oral hypoglycemic drugs: Secondary | ICD-10-CM | POA: Insufficient documentation

## 2018-01-05 DIAGNOSIS — I10 Essential (primary) hypertension: Secondary | ICD-10-CM | POA: Diagnosis not present

## 2018-01-05 DIAGNOSIS — K640 First degree hemorrhoids: Secondary | ICD-10-CM | POA: Insufficient documentation

## 2018-01-05 DIAGNOSIS — E1142 Type 2 diabetes mellitus with diabetic polyneuropathy: Secondary | ICD-10-CM | POA: Insufficient documentation

## 2018-01-05 DIAGNOSIS — F17210 Nicotine dependence, cigarettes, uncomplicated: Secondary | ICD-10-CM | POA: Diagnosis not present

## 2018-01-05 DIAGNOSIS — Z79899 Other long term (current) drug therapy: Secondary | ICD-10-CM | POA: Diagnosis not present

## 2018-01-05 DIAGNOSIS — R195 Other fecal abnormalities: Secondary | ICD-10-CM

## 2018-01-05 DIAGNOSIS — K635 Polyp of colon: Secondary | ICD-10-CM | POA: Diagnosis not present

## 2018-01-05 HISTORY — PX: COLONOSCOPY WITH PROPOFOL: SHX5780

## 2018-01-05 LAB — GLUCOSE, CAPILLARY: Glucose-Capillary: 147 mg/dL — ABNORMAL HIGH (ref 70–99)

## 2018-01-05 SURGERY — COLONOSCOPY WITH PROPOFOL
Anesthesia: General

## 2018-01-05 MED ORDER — PROPOFOL 10 MG/ML IV BOLUS
INTRAVENOUS | Status: DC | PRN
  Administered 2018-01-05: 80 mg via INTRAVENOUS

## 2018-01-05 MED ORDER — MIDAZOLAM HCL 2 MG/2ML IJ SOLN
INTRAMUSCULAR | Status: DC | PRN
  Administered 2018-01-05: 2 mg via INTRAVENOUS

## 2018-01-05 MED ORDER — PROPOFOL 500 MG/50ML IV EMUL
INTRAVENOUS | Status: AC
  Filled 2018-01-05: qty 50

## 2018-01-05 MED ORDER — SODIUM CHLORIDE 0.9 % IV SOLN
INTRAVENOUS | Status: DC
  Administered 2018-01-05: 09:00:00 via INTRAVENOUS

## 2018-01-05 MED ORDER — PROPOFOL 500 MG/50ML IV EMUL
INTRAVENOUS | Status: DC | PRN
  Administered 2018-01-05: 150 ug/kg/min via INTRAVENOUS

## 2018-01-05 MED ORDER — LIDOCAINE HCL (CARDIAC) PF 100 MG/5ML IV SOSY
PREFILLED_SYRINGE | INTRAVENOUS | Status: DC | PRN
  Administered 2018-01-05: 40 mg via INTRAVENOUS

## 2018-01-05 MED ORDER — LIDOCAINE HCL (PF) 2 % IJ SOLN
INTRAMUSCULAR | Status: AC
  Filled 2018-01-05: qty 10

## 2018-01-05 MED ORDER — MIDAZOLAM HCL 2 MG/2ML IJ SOLN
INTRAMUSCULAR | Status: AC
  Filled 2018-01-05: qty 2

## 2018-01-05 NOTE — Transfer of Care (Signed)
Immediate Anesthesia Transfer of Care Note  Patient: Alison Mays  Procedure(s) Performed: Procedure(s): COLONOSCOPY WITH PROPOFOL (N/A)  Patient Location: PACU and Endoscopy Unit  Anesthesia Type:General  Level of Consciousness: sedated  Airway & Oxygen Therapy: Patient Spontanous Breathing and Patient connected to nasal cannula oxygen  Post-op Assessment: Report given to RN and Post -op Vital signs reviewed and stable  Post vital signs: Reviewed and stable  Last Vitals:  Vitals:   01/05/18 0855 01/05/18 1004  BP: (!) 155/99 123/76  Pulse: 88 89  Resp:  20  Temp: 36.6 C 36.4 C  SpO2: 094% 709%    Complications: No apparent anesthesia complications

## 2018-01-05 NOTE — Anesthesia Post-op Follow-up Note (Signed)
Anesthesia QCDR form completed.        

## 2018-01-05 NOTE — Anesthesia Preprocedure Evaluation (Addendum)
Anesthesia Evaluation  Patient identified by MRN, date of birth, ID band Patient awake    Reviewed: Allergy & Precautions, H&P , NPO status , Patient's Chart, lab work & pertinent test results  Airway Mallampati: II       Dental  (+) Partial Upper   Pulmonary Current Smoker,           Cardiovascular hypertension,      Neuro/Psych  Neuromuscular disease (peripheral neuropathy) negative psych ROS   GI/Hepatic negative GI ROS, Neg liver ROS,   Endo/Other  diabetes  Renal/GU negative Renal ROS  negative genitourinary   Musculoskeletal   Abdominal   Peds  Hematology negative hematology ROS (+)   Anesthesia Other Findings Past Medical History: No date: Diabetes mellitus without complication (HCC) No date: Hyperlipidemia No date: Hypertension  Past Surgical History: No date: ABDOMINAL HYSTERECTOMY  BMI    Body Mass Index:  35.33 kg/m      Reproductive/Obstetrics negative OB ROS                            Anesthesia Physical Anesthesia Plan  ASA: II  Anesthesia Plan: General   Post-op Pain Management:    Induction:   PONV Risk Score and Plan: Propofol infusion and TIVA  Airway Management Planned:   Additional Equipment:   Intra-op Plan:   Post-operative Plan:   Informed Consent: I have reviewed the patients History and Physical, chart, labs and discussed the procedure including the risks, benefits and alternatives for the proposed anesthesia with the patient or authorized representative who has indicated his/her understanding and acceptance.   Dental Advisory Given  Plan Discussed with: Anesthesiologist, CRNA and Surgeon  Anesthesia Plan Comments:         Anesthesia Quick Evaluation

## 2018-01-05 NOTE — Op Note (Signed)
[facility] Gastroenterology Patient Name: Alison Mays Procedure Date: 01/05/2018 9:22 AM MRN: [id number] Account #: [id number] Date of Birth: [birth year] Admit Type: Outpatient Age: 67 Room: [facility] ENDO ROOM 4 Gender: Female Note Status: Finalized Procedure:            Colonoscopy Indications:          Positive Cologuard test Providers:            Jonathon Bellows MD, MD Referring MD:         Mikey College (Referring MD) Medicines:            Monitored Anesthesia Care Complications:        No immediate complications. Procedure:            Pre-Anesthesia Assessment:                       - Prior to the procedure, a History and Physical was                        performed, and patient medications, allergies and                        sensitivities were reviewed. The patient's tolerance of                        previous anesthesia was reviewed.                       - The risks and benefits of the procedure and the                        sedation options and risks were discussed with the                        patient. All questions were answered and informed                        consent was obtained.                       - ASA Grade Assessment: II - A patient with mild                        systemic disease.                       After obtaining informed consent, the colonoscope was                        passed under direct vision. Throughout the procedure,                        the patient's blood pressure, pulse, and oxygen                        saturations were monitored continuously. The                        Colonoscope was introduced through the anus and                        advanced  to the the cecum, identified by the                        appendiceal orifice, IC valve and transillumination.                        The Colonoscope was introduced through the and advanced                        to the. The colonoscopy was performed with ease.  The                        patient tolerated the procedure well. The quality of                        the bowel preparation was good. Findings:      The perianal and digital rectal examinations were normal.      Non-bleeding internal hemorrhoids were found during retroflexion. The       hemorrhoids were large and Grade I (internal hemorrhoids that do not       prolapse).      A 5 mm polyp was found in the descending colon. The polyp was sessile.       The polyp was removed with a cold snare. Resection and retrieval were       complete.      The exam was otherwise without abnormality on direct and retroflexion       views. Impression:           - Non-bleeding internal hemorrhoids.                       - One 5 mm polyp in the descending colon, removed with                        a cold snare. Resected and retrieved.                       - The examination was otherwise normal on direct and                        retroflexion views. Recommendation:       - Discharge patient to home (with escort).                       - Resume previous diet.                       - Continue present medications.                       - Await pathology results.                       - Repeat colonoscopy in 5-10 years for surveillance                        based on pathology results. Procedure Code(s):    --- Professional ---                       [phone], Colonoscopy, flexible; with removal of tumor(s),  polyp(s), or other lesion(s) by snare technique Diagnosis Code(s):    --- Professional ---                       D12.4, Benign neoplasm of descending colon                       K64.0, First degree hemorrhoids                       R19.5, Other fecal abnormalities CPT copyright 2018 American Medical Association. All rights reserved. The codes documented in this report are preliminary and upon coder review may  be revised to meet current compliance requirements. Jonathon Bellows,  MD Jonathon Bellows MD, MD 01/05/2018 9:53:07 AM This report has been signed electronically. Number of Addenda: 0 Note Initiated On: 01/05/2018 9:22 AM Scope Withdrawal Time: 0 hours 11 minutes 5 seconds  Total Procedure Duration: 0 hours 14 minutes 10 seconds       [facility]

## 2018-01-05 NOTE — H&P (Signed)
Jonathon Bellows, MD [address], [place], [place], [place], [address] [address], [place], [place], [place], [address] Phone: [phone]  Fax: [phone]  Primary Care Physician:  Mikey College, NP   Pre-Procedure History & Physical: HPI:  Alison Mays is a 67 y.o. female is here for an colonoscopy.   Past Medical History:  Diagnosis Date  . Diabetes mellitus without complication ([place])   . Hyperlipidemia   . Hypertension     Past Surgical History:  Procedure Laterality Date  . ABDOMINAL HYSTERECTOMY      Prior to Admission medications   Medication Sig Start Date End Date Taking? Authorizing Provider  amLODipine (NORVASC) 5 MG tablet Take 1 tablet (5 mg total) by mouth daily. 11/28/17  Yes Mikey College, NP  chlorthalidone (HYGROTON) 25 MG tablet TAKE 1 TABLET(25 MG) BY MOUTH DAILY 06/27/17  Yes Mikey College, NP  cyclobenzaprine (FLEXERIL) 10 MG tablet TAKE 1 TABLET(10 MG) BY MOUTH THREE TIMES DAILY AS NEEDED FOR MUSCLE SPASMS 11/28/17  Yes Mikey College, NP  gabapentin (NEURONTIN) 300 MG capsule Take 1 capsule (300 mg total) by mouth 3 (three) times daily. 07/29/17  Yes Mikey College, NP  glipiZIDE (GLUCOTROL) 10 MG tablet TAKE 1 TABLET(10 MG) BY MOUTH TWICE DAILY BEFORE A MEAL 10/14/17  Yes Mikey College, NP  INCRUSE ELLIPTA 62.5 MCG/INH AEPB INHALE 1 PUFF INTO THE LUNGS DAILY 10/22/17  Yes Mikey College, NP  losartan (COZAAR) 100 MG tablet Take 100 mg by mouth daily.   Yes [provider]  metFORMIN (GLUCOPHAGE-XR) 750 MG 24 hr tablet Take 1 tablet (750 mg total) by mouth 2 (two) times daily. 07/14/17  Yes Mikey College, NP  metoprolol tartrate (LOPRESSOR) 50 MG tablet Take 1 tablet (50 mg total) by mouth 2 (two) times daily. 11/28/17  Yes Mikey College, NP  rosuvastatin (CRESTOR) 40 MG tablet Take 1 tablet (40 mg total) by mouth daily. 12/09/17  Yes Mikey College, NP  VENTOLIN HFA  108 (90 Base) MCG/ACT inhaler INHALE 1 TO 2 PUFFS BY MOUTH INTO THE LUNGS EVERY 6 HOURS AS NEEDED FOR SHORTNESS OF BREATH 10/13/17  Yes Mikey College, NP  ipratropium (ATROVENT) 0.06 % nasal spray Place 2 sprays into both nostrils 4 (four) times daily for 5 days. 10/23/17 10/28/17  Mikey College, NP  [facility] VERIO test strip U 1 STRIP TO TEST BID UTD 07/23/17   [provider]    Allergies as of 12/22/2017  . (No Known Allergies)    Family History  Problem Relation Age of Onset  . Healthy Mother   . Diabetes Father   . Heart attack Sister   . Diabetes Brother   . Diabetes Sister   . Stomach cancer Sister   . Healthy Son   . Healthy Daughter     Social History   Socioeconomic History  . Marital status: Married    Spouse name: Not on file  . Number of children: Not on file  . Years of education: Not on file  . Highest education level: Not on file  Occupational History  . Not on file  Social Needs  . Financial resource strain: Not hard at all  . Food insecurity:    Worry: Never true    Inability: Never true  . Transportation needs:    Medical: No    Non-medical: No  Tobacco Use  . Smoking status: Current Every Day Smoker  Packs/day: 0.25    Years: 40.00    Pack years: 10.00    Types: Cigarettes  . Smokeless tobacco: Never Used  . Tobacco comment: intermittent smoker over past 40 years, quit at time up to 6 months  Substance and Sexual Activity  . Alcohol use: Never    Frequency: Never  . Drug use: Never  . Sexual activity: Not on file  Lifestyle  . Physical activity:    Days per week: 0 days    Minutes per session: 0 min  . Stress: Not at all  Relationships  . Social connections:    Talks on phone: More than three times a week    Gets together: Once a week    Attends religious service: Never    Active member of club or organization: No    Attends meetings of clubs or organizations: Never    Relationship status: Married  . Intimate  partner violence:    Fear of current or ex partner: No    Emotionally abused: No    Physically abused: No    Forced sexual activity: No  Other Topics Concern  . Not on file  Social History Narrative  . Not on file    Review of Systems: See HPI, otherwise negative ROS  Physical Exam: BP (!) 155/99   Pulse 88   Temp 97.8 F (36.6 C) (Oral)   Ht 5\' 1"  (1.549 m)   Wt 84.8 kg   SpO2 100%   BMI 35.33 kg/m  General:   Alert,  pleasant and cooperative in NAD Head:  Normocephalic and atraumatic. Neck:  Supple; no masses or thyromegaly. Lungs:  Clear throughout to auscultation, normal respiratory effort.    Heart:  +S1, +S2, Regular rate and rhythm, No edema. Abdomen:  Soft, nontender and nondistended. Normal bowel sounds, without guarding, and without rebound.   Neurologic:  Alert and  oriented x4;  grossly normal neurologically.  Impression/Plan: Alison Mays is here for an colonoscopy to be performed for positive cologuard    Risks, benefits, limitations, and alternatives regarding  colonoscopy have been reviewed with the patient.  Questions have been answered.  All parties agreeable.   Jonathon Bellows, MD  01/05/2018, 9:19 AM

## 2018-01-06 NOTE — Anesthesia Postprocedure Evaluation (Signed)
Anesthesia Post Note  Patient: Alison Mays  Procedure(s) Performed: COLONOSCOPY WITH PROPOFOL (N/A )  Patient location during evaluation: PACU Anesthesia Type: General Level of consciousness: awake and alert Pain management: pain level controlled Vital Signs Assessment: post-procedure vital signs reviewed and stable Respiratory status: spontaneous breathing, nonlabored ventilation, respiratory function stable and patient connected to nasal cannula oxygen Cardiovascular status: blood pressure returned to baseline and stable Postop Assessment: no apparent nausea or vomiting Anesthetic complications: no     Last Vitals:  Vitals:   01/05/18 1024 01/05/18 1034  BP: (!) 146/106 (!) 159/107  Pulse: 85 84  Resp: 15 20  Temp:    SpO2: 100% 100%    Last Pain:  Vitals:   01/05/18 1034  TempSrc:   PainSc: 0-No pain                 Durenda Hurt

## 2018-01-07 LAB — SURGICAL PATHOLOGY

## 2018-01-11 ENCOUNTER — Encounter: Payer: Self-pay | Admitting: Gastroenterology

## 2018-01-11 ENCOUNTER — Other Ambulatory Visit: Payer: Self-pay | Admitting: Nurse Practitioner

## 2018-01-11 DIAGNOSIS — E1142 Type 2 diabetes mellitus with diabetic polyneuropathy: Secondary | ICD-10-CM

## 2018-01-12 ENCOUNTER — Other Ambulatory Visit: Payer: Self-pay | Admitting: Nurse Practitioner

## 2018-01-12 DIAGNOSIS — E1142 Type 2 diabetes mellitus with diabetic polyneuropathy: Secondary | ICD-10-CM

## 2018-01-16 ENCOUNTER — Telehealth: Payer: Self-pay

## 2018-01-16 NOTE — Telephone Encounter (Signed)
Spoke with pt and informed her that the lab was not able to complete the biopsy on the polyp that was removed during her procedure. I explained that Dr. Vicente Males recommends repeating the colonoscopy in 5 years.

## 2018-01-16 NOTE — Telephone Encounter (Signed)
-----   Message from Jonathon Bellows, MD sent at 01/11/2018  5:11 PM EST ----- Sherald Hess inform I did remove a polyp from the colon which am certain about. Appears the sample sent to the lab was not the polyp but some fecal material. Occasionally the polyp gets fragmented durinmg suction and is lost. Hence would suggest repeat colonoscopy in 5 years please  C/c Mikey College, NP

## 2018-01-19 ENCOUNTER — Ambulatory Visit (INDEPENDENT_AMBULATORY_CARE_PROVIDER_SITE_OTHER): Payer: Medicare Other | Admitting: Nurse Practitioner

## 2018-01-19 ENCOUNTER — Other Ambulatory Visit: Payer: Self-pay

## 2018-01-19 ENCOUNTER — Encounter: Payer: Self-pay | Admitting: Nurse Practitioner

## 2018-01-19 VITALS — BP 116/97 | HR 96 | Temp 98.4°F | Resp 18 | Ht 61.0 in | Wt 181.6 lb

## 2018-01-19 DIAGNOSIS — R059 Cough, unspecified: Secondary | ICD-10-CM

## 2018-01-19 DIAGNOSIS — I10 Essential (primary) hypertension: Secondary | ICD-10-CM | POA: Diagnosis not present

## 2018-01-19 DIAGNOSIS — J189 Pneumonia, unspecified organism: Secondary | ICD-10-CM | POA: Diagnosis not present

## 2018-01-19 DIAGNOSIS — G629 Polyneuropathy, unspecified: Secondary | ICD-10-CM

## 2018-01-19 DIAGNOSIS — R252 Cramp and spasm: Secondary | ICD-10-CM | POA: Diagnosis not present

## 2018-01-19 DIAGNOSIS — R05 Cough: Secondary | ICD-10-CM

## 2018-01-19 MED ORDER — LOSARTAN POTASSIUM 100 MG PO TABS: 100.0000 mg | ORAL_TABLET | Freq: Every day | ORAL | 1 refills | Status: DC

## 2018-01-19 MED ORDER — GABAPENTIN 300 MG PO CAPS: ORAL_CAPSULE | ORAL | 5 refills | Status: DC

## 2018-01-19 MED ORDER — CEFTRIAXONE SODIUM 1 G IJ SOLR
1.0000 g | Freq: Once | INTRAMUSCULAR | Status: AC
  Administered 2018-01-19: 1 g via INTRAMUSCULAR

## 2018-01-19 MED ORDER — BENZONATATE 100 MG PO CAPS: 100.0000 mg | ORAL_CAPSULE | Freq: Three times a day (TID) | ORAL | 0 refills | Status: DC | PRN

## 2018-01-19 MED ORDER — AZITHROMYCIN 250 MG PO TABS: ORAL_TABLET | ORAL | 0 refills | Status: DC

## 2018-01-19 MED ORDER — CYCLOBENZAPRINE HCL 10 MG PO TABS: ORAL_TABLET | ORAL | 2 refills | Status: DC

## 2018-01-19 NOTE — Patient Instructions (Addendum)
[place],   Thank you for coming in to clinic today.  1. You likely have pneumonia. It sounds like you have a cold with pneumonia. Recommend good hand washing. - START azithromycin 250 mg tablet. Take 2 tablets today.  Then, take 1 tablet daily for the next 4 days and stop.  Make sure to take all doses of your antibiotic. - While you are on an antibiotic, take a probiotic.  Antibiotics kill good and bad bacteria.  A probiotic helps to replace your good bacteria. Probiotic pills can be found over the counter.  One brand is Florastor, but you can use any brand you prefer.  You can also get good bacteria from foods.  These foods are yogurt, kefir, kombucha, and fresh, refrigerated and uncooked sauerkraut. - Drink plenty of fluids.  - You may also use Flonase 2 sprays each nostril daily for up to 4-6 weeks - If congestion is worse, start OTC Mucinex (or may try Mucinex-DM for cough) up to 7-10 days then stop - You may try over the counter Nasal Saline spray (Simply Saline, Ocean Spray) as needed to reduce congestion. - Start taking Tylenol extra strength 1 to 2 tablets every 6-8 hours for aches or fever/chills for next few days as needed.  Do not take more than 3,000 mg in 24 hours from all medicines.  You may also take ibuprofen 200-400mg  every 8 hours as needed.   - Drink warm herbal tea with honey for sore throat.  - Start benzonatate 100 mg capsules three times daily for cough - You have received an antibiotic injection today.  If symptoms are significantly worse with persistent fevers/chills despite tylenol/ibpurofen, nausea, vomiting unable to tolerate food/fluids or medicine, body aches, or shortness of breath, sinus pain pressure or worsening productive cough, then follow-up for re-evaluation, may seek more immediate care at Urgent Care or the ED if you are more concerned that it is an emergency.  Please schedule a follow-up appointment with [name], AGNP. Return if symptoms  worsen or fail to improve.  If you have any other questions or concerns, please feel free to call the clinic or send a message through [place]. You may also schedule an earlier appointment if necessary.  You will receive a survey after today's visit either digitally by e-mail or paper by [employer]. Your experiences and feedback matter to [place].  Please respond so we know how we are doing as we provide care for you.   [name], DNP, AGNP-BC Adult Gerontology Nurse Practitioner [place]

## 2018-01-19 NOTE — Progress Notes (Signed)
Subjective:    Patient ID: Alison Mays, female    DOB: 07/23/1950, 67 y.o.   MRN: [id number]  Alison Mays is a 67 y.o. female presenting on 01/19/2018 for Cough (sneezing, nasal drainage x 4 days ) and Pain (bilateral foot pain)   HPI URI symptoms Patient has sick contact with granddaughter  URI and husband who had URI and subsequent pneumonia.  Patient's symptoms started 4 days ago. Cough, sore throat, swollen lymph nodes, sneezing, nasal drainage.   Has had no fever, but has had sweats Saturday and Sunday night (0 and 1 day ago).     Bilateral Foot Pain Patient reports neuropathy with bilateral foot pain worst at bedtime.  Patient has taken gabapentin 300 mg three times daily.  Then has had numbness and aching of toes.    Patient also requests refill of baclofen and losartan.  Social History   Tobacco Use  . Smoking status: Current Every Day Smoker    Packs/day: 0.25    Years: 40.00    Pack years: 10.00    Types: Cigarettes  . Smokeless tobacco: Never Used  . Tobacco comment: intermittent smoker over past 40 years, quit at time up to 6 months  Substance Use Topics  . Alcohol use: Never    Frequency: Never  . Drug use: Never    Review of Systems Per HPI unless specifically indicated above     Objective:    BP (!) 116/97 (BP Location: Right Arm, Patient Position: Sitting, Cuff Size: Normal)   Pulse 96   Temp 98.4 F (36.9 C) (Oral)   Resp 18   Ht 5\' 1"  (1.549 m)   Wt 181 lb 9.6 oz (82.4 kg)   SpO2 99%   BMI 34.31 kg/m   Wt Readings from Last 3 Encounters:  01/19/18 181 lb 9.6 oz (82.4 kg)  01/05/18 187 lb (84.8 kg)  11/28/17 191 lb 6.4 oz (86.8 kg)    Physical Exam Vitals signs reviewed.  Constitutional:      Appearance: She is well-developed.  HENT:     Head: Normocephalic and atraumatic.     Right Ear: Hearing, tympanic membrane, ear canal and external ear normal.     Left Ear: Hearing, tympanic membrane, ear canal and external ear normal.       Nose: Mucosal edema and rhinorrhea present.     Right Sinus: Maxillary sinus tenderness and frontal sinus tenderness present.     Left Sinus: Maxillary sinus tenderness and frontal sinus tenderness present.     Mouth/Throat:     Lips: Pink.     Mouth: Mucous membranes are moist.     Pharynx: Uvula midline. Pharyngeal swelling present. No oropharyngeal exudate (clear secretions) or uvula swelling.     Tonsils: No tonsillar exudate. Swelling: 1+ on the right. 1+ on the left.  Eyes:     General: Lids are normal.        Right eye: No discharge.        Left eye: No discharge.     Conjunctiva/sclera: Conjunctivae normal.     Pupils: Pupils are equal, round, and reactive to light.  Neck:     Musculoskeletal: Full passive range of motion without pain, normal range of motion and neck supple.  Cardiovascular:     Rate and Rhythm: Normal rate and regular rhythm.     Pulses: Normal pulses.     Heart sounds: Normal heart sounds, S1 normal and S2 normal.  Pulmonary:  Effort: Pulmonary effort is normal. No respiratory distress.     Breath sounds: Wheezing and rhonchi present.  Musculoskeletal:     Right lower leg: No edema.     Left lower leg: No edema.  Lymphadenopathy:     Cervical: Cervical adenopathy present.     Right cervical: Superficial cervical adenopathy present.     Left cervical: Superficial cervical adenopathy present.  Skin:    General: Skin is warm and dry.     Capillary Refill: Capillary refill takes less than 2 seconds.  Neurological:     General: No focal deficit present.     Mental Status: She is alert.     GCS: GCS eye subscore is 4. GCS verbal subscore is 5. GCS motor subscore is 6.  Psychiatric:        Attention and Perception: Attention normal.        Mood and Affect: Mood normal.        Behavior: Behavior normal. Behavior is cooperative.       Results for orders placed or performed during the hospital encounter of 01/05/18  Glucose, capillary  Result  Value Ref Range   Glucose-Capillary 147 (H) 70 - 99 mg/dL  Surgical pathology  Result Value Ref Range   SURGICAL PATHOLOGY      Surgical Pathology CASE: [id number] PATIENT: Zoella Eggert Surgical Pathology Report     SPECIMEN SUBMITTED: A. Colon polyp x1, descending; cold snare  CLINICAL HISTORY: None provided  PRE-OPERATIVE DIAGNOSIS: Positive Cologuard  POST-OPERATIVE DIAGNOSIS: Colon polyp, hemorrhoids     DIAGNOSIS: A.  COLON POLYP X 1, DESCENDING; COLD SNARE: - SCANT FECAL MATERIAL. - NO TISSUE IDENTIFIED IN CONTAINER OR ON SLIDES.  GROSS DESCRIPTION: A. Labeled: Descending colon polyp cold snare x1 Received: In formalin Tissue fragment(s): No grossly identified tissue Description: Formalin filled container filtered, container and lid rinsed into a mesh bag, bag marked orange, no visible tissue, entirely submitted for possible minuscule tissue Entirely submitted in 1 cassette.    Final Diagnosis performed by Bryan Lemma, MD.   Electronically signed 01/07/2018 7:39:54PM The electronic signature indicates that the named Attending Pathologist has evaluated the spe cimen  Technical component performed at [facility], [address], [place], [place] [address] Lab: [phone] Dir: Rush Farmer, MD, MMM  Professional component performed at [facility], [facility], [place], [place], [place] [address] Lab: [phone] Dir: Dellia Nims. Rubinas, MD       Assessment & Plan:   Problem List Items Addressed This Visit      Cardiovascular and Mediastinum   Essential hypertension - Primary   Relevant Medications   losartan (COZAAR) 100 MG tablet    Other Visit Diagnoses    Foot spasms       Relevant Medications   cyclobenzaprine (FLEXERIL) 10 MG tablet   Neuropathy       Relevant Medications   gabapentin (NEURONTIN) 300 MG capsule   Cough       Relevant Medications   benzonatate (TESSALON) 100 MG capsule   Community acquired  pneumonia, unspecified laterality       Relevant Medications   benzonatate (TESSALON) 100 MG capsule   cefTRIAXone (ROCEPHIN) injection 1 g (Completed)   azithromycin (ZITHROMAX) 250 MG tablet     # Presumed CAP in high risk patient with DM. May have evolved from prolonged viral URI / bronchitis. Not recent hospitalization or IV antibiotics within past 90 days. Patient with family contact also with CAP.  No history of asthma or  COPD and no acute wheezing and minimal response to albuterol.  - Afebrile, tachycardic, no hypoxia (99% on RA)  Plan: 1. Start empiric antibiotics with Ceftriaxone IM 1g x 1 dose in office, Azithromycin PO 500mg  (day 1) then 250mg  daily x 4 days, Doxycycline 100mg  BID x 10 days 2. Recommend improve hydration and add Mucinex-DM twice daily for up to 5-7 days if sputum still thick 3. Tessalon Perls for cough TID PRN 4. Tylenol, Ibuprofen PRN fevers 5. RTC within 1 week if not improving, otherwise strict return criteria to go to ED    # Peripheral neuropathy Worsening at bedtime.  Gabapentin still largely effective.  Increase dose to 2 capusles 600 mg at bedtime. Continue 300 mg in am and in afternoon.  Follow-up prn.   Meds ordered this encounter  Medications  . cyclobenzaprine (FLEXERIL) 10 MG tablet    Sig: TAKE 1 TABLET(10 MG) BY MOUTH THREE TIMES DAILY AS NEEDED FOR MUSCLE SPASMS    Dispense:  60 tablet    Refill:  2  . losartan (COZAAR) 100 MG tablet    Sig: Take 1 tablet (100 mg total) by mouth daily.    Dispense:  90 tablet    Refill:  1  . gabapentin (NEURONTIN) 300 MG capsule    Sig: Take 1 capsule (300 mg total) in morning, in afternoon and 2 capsules (600 mg total) at bedtime.    Dispense:  120 capsule    Refill:  5    Order Specific Question:   Supervising Provider    Answer:   Olin Hauser [[id number]]  . benzonatate (TESSALON) 100 MG capsule    Sig: Take 1 capsule (100 mg total) by mouth 3 (three) times daily as needed for cough.     Dispense:  20 capsule    Refill:  0    Order Specific Question:   Supervising Provider    Answer:   Olin Hauser [[id number]]  . cefTRIAXone (ROCEPHIN) injection 1 g  . azithromycin (ZITHROMAX) 250 MG tablet    Sig: Take one tablet twice today.  Then, take 1 tablet daily for 4 days.    Dispense:  6 tablet    Refill:  0    Order Specific Question:   Supervising Provider    Answer:   Olin Hauser [[id number]]   Follow up plan: Return if symptoms worsen or fail to improve.  [name], DNP, AGPCNP-BC Adult Gerontology Primary Care Nurse Practitioner [facility] 01/19/2018, 11:49 AM

## 2018-01-20 ENCOUNTER — Encounter: Payer: Self-pay | Admitting: Nurse Practitioner

## 2018-02-25 ENCOUNTER — Other Ambulatory Visit: Payer: Self-pay

## 2018-02-25 ENCOUNTER — Ambulatory Visit (INDEPENDENT_AMBULATORY_CARE_PROVIDER_SITE_OTHER): Payer: Medicare Other | Admitting: Nurse Practitioner

## 2018-02-25 ENCOUNTER — Encounter: Payer: Self-pay | Admitting: Nurse Practitioner

## 2018-02-25 VITALS — BP 135/89 | HR 79 | Temp 98.2°F | Ht 61.0 in | Wt 187.8 lb

## 2018-02-25 DIAGNOSIS — I10 Essential (primary) hypertension: Secondary | ICD-10-CM

## 2018-02-25 DIAGNOSIS — Z111 Encounter for screening for respiratory tuberculosis: Secondary | ICD-10-CM | POA: Diagnosis not present

## 2018-02-25 DIAGNOSIS — E782 Mixed hyperlipidemia: Secondary | ICD-10-CM | POA: Diagnosis not present

## 2018-02-25 DIAGNOSIS — E1142 Type 2 diabetes mellitus with diabetic polyneuropathy: Secondary | ICD-10-CM

## 2018-02-25 MED ORDER — ONETOUCH VERIO VI STRP: ORAL_STRIP | 3 refills | Status: DC

## 2018-02-25 NOTE — Patient Instructions (Addendum)
[place],   Thank you for coming in to clinic today.  1. No medicine changes today.  2. You will be due for FASTING BLOOD WORK.  This means you should eat no food or drink after midnight.  Drink only water or coffee without cream/sugar on the morning of your lab visit. - Please go ahead and schedule a "Lab Only" visit in the morning at the clinic for lab draw in the next 7 days. - Your results will be available about 2-3 days after blood draw.  If you have set up a MyChart account, you can can log in to MyChart online to view your results and a brief explanation. Also, we can discuss your results together at your next office visit if you would like.  Please schedule a follow-up appointment with [name], AGNP. Return in about 3 months (around 05/26/2018) for diabetes, cholesterol, hypertension.  If you have any other questions or concerns, please feel free to call the clinic or send a message through [place]. You may also schedule an earlier appointment if necessary.  You will receive a survey after today's visit either digitally by e-mail or paper by [employer]. Your experiences and feedback matter to [place].  Please respond so we know how we are doing as we provide care for you.   [name], DNP, AGNP-BC Adult Gerontology Nurse Practitioner [place]

## 2018-02-25 NOTE — Progress Notes (Signed)
Subjective:    Patient ID: Alison Mays, female    DOB: 25-Mar-1950, 68 y.o.   MRN: [id number]  Alison Mays is a 68 y.o. female presenting on 02/25/2018 for PPD Placement   HPI Pre-employment screening: - Mild back pain, but has been limited in movement.   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 Past positive and had treatment many years ago for 1 year duration.  May need Xray.  Has no paperwork at this time.   Diabetes Pt presents today for follow up of Type 2 diabetes mellitus. She is checking fasting am CBG at home with a range of 115-136, Rarely is over 150;  High 160s, lowest 87 - Current diabetic medications include: metformin XR 750 mg x 2 tabs at bedtime without side effects. - She is not currently symptomatic.  - She denies polydipsia, polyphagia, polyuria, headaches, diaphoresis, shakiness, chills, pain, numbness or tingling in extremities and changes in vision.   - Clinical course has been stable. - She  reports no regular exercise routine, but is starting regular substitution at daycare which is keeping her active. - Her diet is moderate in salt, moderate in fat, and moderate to high in carbohydrates. - Weight trend: increasing steadily  PREVENTION: Eye exam current (within one year): no Foot exam current (within one year): yes  Lipid/ASCVD risk reduction - on statin: y Kidney protection - on ace or arb: y Recent Labs    05/22/17 1037 11/28/17 1001  HGBA1C 7.4% 6.9*   Hypertension - She is not checking BP at home or outside of clinic.    - Current medications: amlodipine 5 mg daily, chlorthalidone 25 mg once daily, losartan 100 mg daily, tolerating well without side effects - She is not currently symptomatic. - Pt denies headache, lightheadedness, dizziness, changes in vision, chest tightness/pressure, palpitations, leg swelling, sudden loss of speech or loss of consciousness.  Hyperlipidemia Patient is taking rosuvastatin 40 mg once daily  and tolerates well without side effects. This was changed from atorvastatin 80 at last visit due to slightly higher potency statin for achieving LDL goals. - Pt denies changes in vision, chest tightness/pressure, palpitations, shortness of breath, leg pain while walking, leg or arm weakness, and sudden loss of speech or loss of consciousness.   Social History   Tobacco Use  . Smoking status: Current Every Day Smoker    Packs/day: 0.25    Years: 40.00    Pack years: 10.00    Types: Cigarettes  . Smokeless tobacco: Never Used  . Tobacco comment: intermittent smoker over past 40 years, quit at time up to 6 months  Substance Use Topics  . Alcohol use: Never    Frequency: Never  . Drug use: Never    Review of Systems Per HPI unless specifically indicated above     Objective:    BP 135/89 (BP Location: Right Arm, Patient Position: Sitting, Cuff Size: Normal)   Pulse 79   Temp 98.2 F (36.8 C) (Oral)   Ht 5\' 1"  (1.549 m)   Wt 187 lb 12.8 oz (85.2 kg)   BMI 35.48 kg/m   Wt Readings from Last 3 Encounters:  02/25/18 187 lb 12.8 oz (85.2 kg)  01/19/18 181 lb 9.6 oz (82.4 kg)  01/05/18 187 lb (84.8 kg)    Physical Exam Vitals signs reviewed.  Constitutional:      General: She is not in acute distress.    Appearance: She is well-developed.  HENT:  Head: Normocephalic and atraumatic.     Mouth/Throat:     Mouth: Mucous membranes are moist.     Pharynx: Oropharynx is clear.  Cardiovascular:     Rate and Rhythm: Normal rate and regular rhythm.     Pulses:          Radial pulses are 2+ on the right side and 2+ on the left side.       Posterior tibial pulses are 1+ on the right side and 1+ on the left side.     Heart sounds: Normal heart sounds, S1 normal and S2 normal.  Pulmonary:     Effort: Pulmonary effort is normal. No respiratory distress.     Breath sounds: Normal breath sounds and air entry.  Musculoskeletal:     Right lower leg: No edema.     Left lower leg: No  edema.  Skin:    General: Skin is warm and dry.     Capillary Refill: Capillary refill takes less than 2 seconds.  Neurological:     Mental Status: She is alert and oriented to person, place, and time.  Psychiatric:        Attention and Perception: Attention normal.        Mood and Affect: Mood and affect normal.        Behavior: Behavior normal. Behavior is cooperative.        Thought Content: Thought content normal.        Judgment: Judgment normal.       Assessment & Plan:   Problem List Items Addressed This Visit      Cardiovascular and Mediastinum   Essential hypertension Nearly controlled hypertension.  BP goal < 130/80.  Pt is now working on lifestyle modifications.  Taking medications tolerating well without side effects. No current complications.  Plan: 1. Continue taking medications without change. May consider future increase of amlodipine if BP remains above goal.  Encouraged lifestyle modification first since BP has had better control at previous visits with better lifestyle 2. Obtain labs in next 7 days.  3. Encouraged heart healthy diet and increasing exercise to 30 minutes most days of the week. 4. Check BP 1-2 x per week at home, keep log, and bring to clinic at next appointment. 5. Follow up 3 months.     Relevant Orders   Comprehensive metabolic panel     Endocrine   Type 2 diabetes mellitus with peripheral neuropathy (HCC) Previously controlledDM with A1c goal A1c < 7.0%. - Complications - dyslipidemia, peripheral neuropathy and hyperglycemia.  Plan:  1. Continue current therapy: make changes prn after A1c check 2. Encourage improved lifestyle: - low carb/low glycemic diet reinforced prior education - Increase physical activity to 30 minutes most days of the week.  Explained that increased physical activity increases body's use of sugar for energy. 3. Check fasting am CBG and bring log to next visit for review 4. Continue ASA, ARB and Statin 5. Advised  to schedule DM ophtho exam, send record.  6. Follow-up 3 months    Relevant Medications   ONETOUCH VERIO test strip   Other Relevant Orders   Comprehensive metabolic panel   Hemoglobin A1c   Lipid panel     Other   Hyperlipidemia Suboptimally controlled hyperlipidemia on last check.  Currently taking rosuvastatin 40 mg onc daily. No new ASCVD events since last visit.   - No personal history of prior ASCVD events  Plan: 1. Continue taking rosuvastatin 40 mg once daily. - If  needing additional LDL lowering, can consider Repatha or ezetimibe (cost dependent) 2. Discussed low glycemic diet. - handout provided 3. Discussed increasing exercise to 30 minutes most days of the week. 4. Follow-up 3 months.    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 Prior positive ppd with treatment. Needs CXR or blood testing.  After discussion mutual decision to perform quantiferon. Follow-up with Xray if quantiferon is positive.     Relevant Orders   QuantiFERON-TB Gold Plus      Meds ordered this encounter  Medications  . ONETOUCH VERIO test strip    Sig: Use 1 STRIP TO TEST BID UTD    Dispense:  100 each    Refill:  3    Follow up plan: Return in about 3 months (around 05/26/2018) for diabetes, cholesterol, hypertension.  [name], DNP, AGPCNP-BC Adult Gerontology Primary Care Nurse Practitioner [facility] 02/25/2018, 3:44 PM

## 2018-03-02 ENCOUNTER — Ambulatory Visit: Payer: Medicare Other | Admitting: Nurse Practitioner

## 2018-03-03 ENCOUNTER — Telehealth: Payer: Self-pay | Admitting: Nurse Practitioner

## 2018-03-03 DIAGNOSIS — E1142 Type 2 diabetes mellitus with diabetic polyneuropathy: Secondary | ICD-10-CM

## 2018-03-03 NOTE — Telephone Encounter (Signed)
Pt called said that she need a refill on One Touch [facility] called in

## 2018-03-04 MED ORDER — ONETOUCH DELICA LANCETS 33G MISC: 1.0000 | Freq: Every day | 4 refills | Status: DC

## 2018-03-05 LAB — COMPREHENSIVE METABOLIC PANEL
ALT: 21 IU/L (ref 0–32)
AST: 20 IU/L (ref 0–40)
Albumin/Globulin Ratio: 1.9 (ref 1.2–2.2)
Albumin: 4.7 g/dL (ref 3.8–4.8)
Alkaline Phosphatase: 98 IU/L (ref 39–117)
BUN/Creatinine Ratio: 12 (ref 12–28)
BUN: 10 mg/dL (ref 8–27)
Bilirubin Total: 0.4 mg/dL (ref 0.0–1.2)
CO2: 23 mmol/L (ref 20–29)
Calcium: 10.8 mg/dL — ABNORMAL HIGH (ref 8.7–10.3)
Chloride: 97 mmol/L (ref 96–106)
Creatinine, Ser: 0.82 mg/dL (ref 0.57–1.00)
GFR calc Af Amer: 86 mL/min/{1.73_m2} (ref >59)
GFR calc non Af Amer: 75 mL/min/{1.73_m2} (ref >59)
Globulin, Total: 2.5 g/dL (ref 1.5–4.5)
Glucose: 114 mg/dL — ABNORMAL HIGH (ref 65–99)
Potassium: 3.9 mmol/L (ref 3.5–5.2)
Sodium: 139 mmol/L (ref 134–144)
Total Protein: 7.2 g/dL (ref 6.0–8.5)

## 2018-03-05 LAB — LIPID PANEL
Chol/HDL Ratio: 3.5 ratio (ref 0.0–4.4)
Cholesterol, Total: 130 mg/dL (ref 100–199)
HDL: 37 mg/dL — ABNORMAL LOW (ref >39)
LDL Calculated: 69 mg/dL (ref 0–99)
Triglycerides: 119 mg/dL (ref 0–149)
VLDL Cholesterol Cal: 24 mg/dL (ref 5–40)

## 2018-03-05 LAB — HEMOGLOBIN A1C
Est. average glucose Bld gHb Est-mCnc: 160 mg/dL
Hgb A1c MFr Bld: 7.2 % — ABNORMAL HIGH (ref 4.8–5.6)

## 2018-03-05 LAB — QUANTIFERON-TB GOLD PLUS
QuantiFERON Mitogen Value: >10 IU/mL
QuantiFERON Nil Value: 0.03 IU/mL
QuantiFERON TB1 Ag Value: 0.03 IU/mL
QuantiFERON TB2 Ag Value: 0.03 IU/mL
QuantiFERON-TB Gold Plus: NEGATIVE

## 2018-03-11 ENCOUNTER — Other Ambulatory Visit: Payer: Self-pay | Admitting: Nurse Practitioner

## 2018-03-11 DIAGNOSIS — I10 Essential (primary) hypertension: Secondary | ICD-10-CM

## 2018-03-23 ENCOUNTER — Other Ambulatory Visit: Payer: Self-pay | Admitting: Nurse Practitioner

## 2018-03-23 DIAGNOSIS — G629 Polyneuropathy, unspecified: Secondary | ICD-10-CM

## 2018-03-26 ENCOUNTER — Encounter: Payer: Self-pay | Admitting: Nurse Practitioner

## 2018-03-26 ENCOUNTER — Ambulatory Visit (INDEPENDENT_AMBULATORY_CARE_PROVIDER_SITE_OTHER): Payer: Medicare Other | Admitting: Nurse Practitioner

## 2018-03-26 ENCOUNTER — Other Ambulatory Visit: Payer: Self-pay

## 2018-03-26 VITALS — BP 146/89 | HR 95 | Temp 98.2°F | Resp 16 | Ht 61.0 in | Wt 182.6 lb

## 2018-03-26 DIAGNOSIS — R350 Frequency of micturition: Secondary | ICD-10-CM | POA: Diagnosis not present

## 2018-03-26 DIAGNOSIS — G629 Polyneuropathy, unspecified: Secondary | ICD-10-CM

## 2018-03-26 DIAGNOSIS — N3001 Acute cystitis with hematuria: Secondary | ICD-10-CM

## 2018-03-26 DIAGNOSIS — I10 Essential (primary) hypertension: Secondary | ICD-10-CM

## 2018-03-26 LAB — POCT URINALYSIS DIPSTICK
Bilirubin, UA: NEGATIVE
Glucose, UA: NEGATIVE
Ketones, UA: NEGATIVE
Nitrite, UA: NEGATIVE
Protein, UA: POSITIVE — AB
Spec Grav, UA: 1.025 (ref 1.010–1.025)
Urobilinogen, UA: 1 E.U./dL
pH, UA: 6 (ref 5.0–8.0)

## 2018-03-26 MED ORDER — CEPHALEXIN 500 MG PO CAPS: 500.0000 mg | ORAL_CAPSULE | Freq: Three times a day (TID) | ORAL | 0 refills | Status: AC

## 2018-03-26 MED ORDER — GABAPENTIN 300 MG PO CAPS: ORAL_CAPSULE | ORAL | 1 refills | Status: DC

## 2018-03-26 MED ORDER — AMLODIPINE BESYLATE 5 MG PO TABS: 5.0000 mg | ORAL_TABLET | Freq: Every day | ORAL | 1 refills | Status: DC

## 2018-03-26 NOTE — Progress Notes (Signed)
Subjective:    Patient ID: Alison Mays, female    DOB: [birth year], 68 y.o.   MRN: [id number]  Alison Mays is a 68 y.o. female presenting on 03/26/2018 for Urinary Tract Infection (back hurts, hurts sometimes when urinate, feels the urge to urinate and its not a full bladder which causes her to drip in her underwear)   HPI UTI symptoms Symptoms started about 1 week ago.  Noted stomach and back pain.   - Has had UTI in past, but about 10 years. - no fever, chills, but has been having new sweats. - No current back pain - Continues having bladder pain, urinary urgency, frequency.  Sensation of incomplete emptying.  New incontinence with urgency.  Social History   Tobacco Use  . Smoking status: Current Every Day Smoker    Packs/day: 0.25    Years: 40.00    Pack years: 10.00    Types: Cigarettes  . Smokeless tobacco: Never Used  . Tobacco comment: intermittent smoker over past 40 years, quit at time up to 6 months  Substance Use Topics  . Alcohol use: Never    Frequency: Never  . Drug use: Never    Review of Systems Per HPI unless specifically indicated above     Objective:    BP (!) 146/89   Pulse 95   Temp 98.2 F (36.8 C) (Oral)   Resp 16   Ht 5\' 1"  (1.549 m)   Wt 182 lb 9.6 oz (82.8 kg)   SpO2 100%   BMI 34.50 kg/m   Wt Readings from Last 3 Encounters:  03/26/18 182 lb 9.6 oz (82.8 kg)  02/25/18 187 lb 12.8 oz (85.2 kg)  01/19/18 181 lb 9.6 oz (82.4 kg)    Physical Exam Vitals signs reviewed.  Constitutional:      General: She is not in acute distress.    Appearance: She is well-developed.  HENT:     Head: Normocephalic and atraumatic.  Cardiovascular:     Rate and Rhythm: Normal rate and regular rhythm.     Pulses:          Radial pulses are 2+ on the right side and 2+ on the left side.       Posterior tibial pulses are 1+ on the right side and 1+ on the left side.     Heart sounds: Normal heart sounds, S1 normal and S2 normal.  Pulmonary:     Effort: Pulmonary effort is normal. No respiratory distress.     Breath sounds: Normal breath sounds and air entry.  Abdominal:     General: Bowel sounds are normal. There is no distension.     Palpations: Abdomen is soft.     Tenderness: There is abdominal tenderness in the suprapubic area. There is no right CVA tenderness or left CVA tenderness.     Hernia: No hernia is present.  Musculoskeletal:     Right lower leg: No edema.     Left lower leg: No edema.  Skin:    General: Skin is warm and dry.     Capillary Refill: Capillary refill takes less than 2 seconds.  Neurological:     Mental Status: She is alert and oriented to person, place, and time.  Psychiatric:        Attention and Perception: Attention normal.        Mood and Affect: Mood and affect normal.        Behavior: Behavior normal. Behavior is cooperative.  Results for orders placed or performed in visit on 03/26/18  Urine Culture  Result Value Ref Range   MICRO NUMBER: [id number]    SPECIMEN QUALITY: Adequate    Sample Source URINE    STATUS: FINAL    ISOLATE 1:      Multiple organisms present, each less than 10,000 CFU/mL. These organisms, commonly found on external and internal genitalia, are considered to be colonizers. No further testing performed.  POCT Urinalysis Dipstick  Result Value Ref Range   Color, UA yellow    Clarity, UA clear    Glucose, UA Negative Negative   Bilirubin, UA Negative    Ketones, UA Negative    Spec Grav, UA 1.025 1.010 - 1.025   Blood, UA trace    pH, UA 6.0 5.0 - 8.0   Protein, UA Positive (A) Negative   Urobilinogen, UA 1.0 0.2 or 1.0 E.U./dL   Nitrite, UA Negative    Leukocytes, UA Trace (A) Negative   Appearance     Odor        Assessment & Plan:   Problem List Items Addressed This Visit      Cardiovascular and Mediastinum   Essential hypertension Patient with uncontrolled hypertension on exam today.  Out of amlodipine.  Refill sent.  Follow-up as scheduled.    Relevant Medications   amLODipine (NORVASC) 5 MG tablet    Other Visit Diagnoses    Urinary frequency    -  Primary   Relevant Orders   POCT Urinalysis Dipstick (Completed)   Acute cystitis with hematuria     Acute cystitis with hematuria.  Pt symptomatic currently with increased suprapubic pressure x 7 days. Currently without systemic signs or symptoms of infection.   - No current risk of concurrent STI.  Plan: 1. START Keflex 500mg  3 times daily for next 5 days.   - Send Urine culture 2. Provided non-pharm measures for UTI prevention for good hygiene. 3. Drink plenty of fluids and improve hydration over next 1 week. 4. Provided precautions for severe symptoms requiring ED visit to include no urine in 24-48 hours. 5. Followup 2-5 days as needed for worsening or persistent symptoms.     Relevant Orders   Urine Culture (Completed)   Neuropathy     Patient also requests refill for gabapentin. Refill sent.  Follow-up as scheduled for DM.   Relevant Medications   gabapentin (NEURONTIN) 300 MG capsule      Meds ordered this encounter  Medications  . cephALEXin (KEFLEX) 500 MG capsule    Sig: Take 1 capsule (500 mg total) by mouth 3 (three) times daily for 5 days.    Dispense:  15 capsule    Refill:  0    Order Specific Question:   Supervising Provider    Answer:   Olin Hauser [[id number]]  . gabapentin (NEURONTIN) 300 MG capsule    Sig: TAKE 1 CAPSULE(300 MG) BY MOUTH THREE TIMES DAILY    Dispense:  270 capsule    Refill:  1    Order Specific Question:   Supervising Provider    Answer:   Olin Hauser [[id number]]  . amLODipine (NORVASC) 5 MG tablet    Sig: Take 1 tablet (5 mg total) by mouth daily.    Dispense:  90 tablet    Refill:  1    Order Specific Question:   Supervising Provider    Answer:   Olin Hauser [[id number]]    Follow up plan: Return 5-7 days  if symptoms worsen or fail to improve.  [name], DNP, AGPCNP-BC Adult Gerontology  Primary Care Nurse Practitioner [facility] 03/26/2018, 11:43 AM

## 2018-03-26 NOTE — Patient Instructions (Addendum)
[place],   Thank you for coming in to clinic today.  1. Urinary tract infection is present today. - START Keflex 500mg  3 times daily for next 5 days.   - Drink plenty of fluids and improve hydration over next 1 week. - TAKE AZO over the counter  - generic urinary pain relief is the same thing. Take for up to 2 days.  Then if you have irritation after your antibiotic, repeat for additional 2 days.  Please schedule a follow-up appointment with [name], AGNP. Return 5-7 days if symptoms worsen or fail to improve.  If you have any other questions or concerns, please feel free to call the clinic or send a message through [place]. You may also schedule an earlier appointment if necessary.  You will receive a survey after today's visit either digitally by e-mail or paper by [employer]. Your experiences and feedback matter to [place].  Please respond so we know how we are doing as we provide care for you.   [name], DNP, AGNP-BC Adult Gerontology Nurse Practitioner [place]

## 2018-03-27 LAB — URINE CULTURE
MICRO NUMBER:: 281557
SPECIMEN QUALITY:: ADEQUATE

## 2018-04-02 ENCOUNTER — Encounter: Payer: Self-pay | Admitting: Nurse Practitioner

## 2018-04-06 ENCOUNTER — Telehealth: Payer: Self-pay | Admitting: Nurse Practitioner

## 2018-04-06 DIAGNOSIS — B3731 Acute candidiasis of vulva and vagina: Secondary | ICD-10-CM

## 2018-04-06 DIAGNOSIS — B373 Candidiasis of vulva and vagina: Secondary | ICD-10-CM

## 2018-04-06 DIAGNOSIS — R309 Painful micturition, unspecified: Secondary | ICD-10-CM

## 2018-04-06 MED ORDER — FLUCONAZOLE 150 MG PO TABS: 150.0000 mg | ORAL_TABLET | Freq: Once | ORAL | 0 refills | Status: AC

## 2018-04-06 NOTE — Telephone Encounter (Signed)
Pt still has UTI and was told to call back if not better.  She also has a yeast infection.  Please call [phone]

## 2018-04-06 NOTE — Telephone Encounter (Signed)
Still having pain in back and before she voids.  Patient also reported white discharge now.  Please advise

## 2018-04-07 MED ORDER — PHENAZOPYRIDINE HCL 200 MG PO TABS: 200.0000 mg | ORAL_TABLET | Freq: Three times a day (TID) | ORAL | 0 refills | Status: DC | PRN

## 2018-04-07 NOTE — Telephone Encounter (Signed)
The pt called back complaining of consistent pain on the right side that worsen with walking and urination. She is requesting that we call her in something for a UTI. I informed the patient that her culture x 10 days ago was negative. Please advise

## 2018-04-07 NOTE — Telephone Encounter (Signed)
Patient does feel like she is completely emptying bladder. Has some right-sided stomach pain. Also having back pain.  Has tried AZO for pain which helps some.    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

## 2018-04-20 ENCOUNTER — Other Ambulatory Visit: Payer: Self-pay | Admitting: Nurse Practitioner

## 2018-04-20 DIAGNOSIS — E1142 Type 2 diabetes mellitus with diabetic polyneuropathy: Secondary | ICD-10-CM

## 2018-05-20 ENCOUNTER — Other Ambulatory Visit: Payer: Self-pay | Admitting: Nurse Practitioner

## 2018-05-20 DIAGNOSIS — G629 Polyneuropathy, unspecified: Secondary | ICD-10-CM

## 2018-05-20 DIAGNOSIS — I10 Essential (primary) hypertension: Secondary | ICD-10-CM

## 2018-06-05 ENCOUNTER — Other Ambulatory Visit: Payer: Self-pay | Admitting: Nurse Practitioner

## 2018-06-05 DIAGNOSIS — I10 Essential (primary) hypertension: Secondary | ICD-10-CM

## 2018-06-05 DIAGNOSIS — E782 Mixed hyperlipidemia: Secondary | ICD-10-CM

## 2018-06-18 ENCOUNTER — Other Ambulatory Visit: Payer: Self-pay | Admitting: Nurse Practitioner

## 2018-06-18 DIAGNOSIS — R252 Cramp and spasm: Secondary | ICD-10-CM

## 2018-07-05 ENCOUNTER — Other Ambulatory Visit: Payer: Self-pay | Admitting: Nurse Practitioner

## 2018-07-05 DIAGNOSIS — E1142 Type 2 diabetes mellitus with diabetic polyneuropathy: Secondary | ICD-10-CM

## 2018-07-07 ENCOUNTER — Ambulatory Visit (INDEPENDENT_AMBULATORY_CARE_PROVIDER_SITE_OTHER): Payer: Medicare Other

## 2018-07-07 DIAGNOSIS — Z1239 Encounter for other screening for malignant neoplasm of breast: Secondary | ICD-10-CM

## 2018-07-07 DIAGNOSIS — Z0000 Encounter for general adult medical examination without abnormal findings: Secondary | ICD-10-CM | POA: Diagnosis not present

## 2018-07-07 DIAGNOSIS — Z780 Asymptomatic menopausal state: Secondary | ICD-10-CM | POA: Diagnosis not present

## 2018-07-07 NOTE — Progress Notes (Signed)
Subjective:   Alison Mays is a 68 y.o. female who presents for Medicare Annual (Subsequent) preventive examination.  This visit is being conducted via phone call  - after an attmept to do on video chat - due to the COVID-19 pandemic. This patient has given me verbal consent via phone to conduct this visit, patient states they are participating from their home address. Some vital signs may be absent or patient reported.   Patient identification: identified by name, DOB, and current address.    Review of Systems:   Cardiac Risk Factors include: advanced age (>57men, >8 women);hypertension;diabetes mellitus;dyslipidemia     Objective:     Vitals: There were no vitals taken for this visit.  There is no height or weight on file to calculate BMI.  Advanced Directives 07/07/2018 01/05/2018 07/01/2017  Does Patient Have a Medical Advance Directive? No No No  Would patient like information on creating a medical advance directive? - No - Patient declined Yes (MAU/Ambulatory/Procedural Areas - Information given)    Tobacco Social History   Tobacco Use  Smoking Status Current Every Day Smoker   Packs/day: 0.25   Years: 40.00   Pack years: 10.00   Types: Cigarettes  Smokeless Tobacco Never Used  Tobacco Comment   intermittent smoker over past 40 years, quit at time up to 6 months     Ready to quit: Yes Counseling given: No Comment: intermittent smoker over past 40 years, quit at time up to 6 months   Clinical Intake:  Pre-visit preparation completed: Yes  Pain : No/denies pain     Nutritional Risks: None Diabetes: Yes CBG done?: No Did pt. bring in CBG monitor from home?: No  How often do you need to have someone help you when you read instructions, pamphlets, or other written materials from your doctor or pharmacy?: 1 - Never What is the last grade level you completed in school?: high school  Nutrition Risk Assessment:  Has the patient had any N/V/D within  the last 2 months?  No  Does the patient have any non-healing wounds?  No  Has the patient had any unintentional weight loss or weight gain?  No   Diabetes:  Is the patient diabetic?  Yes  If diabetic, was a CBG obtained today?  No  Did the patient bring in their glucometer from home?  n/a How often do you monitor your CBG's? BID.   Financial Strains and Diabetes Management:  Are you having any financial strains with the device, your supplies or your medication? No .  Does the patient want to be seen by Chronic Care Management for management of their diabetes?  No  Would the patient like to be referred to a Nutritionist or for Diabetic Management?  No   Diabetic Exams:  Diabetic Eye Exam: Completed 09/2017. Doesn't want to go back to where she went before, recommended local eye doctors.   Diabetic Foot Exam. Pt has been advised about the importance in completing this exam. Due.    Interpreter Needed?: No  Information entered by :: Julieta Rogalski,LPN  Past Medical History:  Diagnosis Date   Diabetes mellitus without complication ([place])    Hyperlipidemia    Hypertension    Past Surgical History:  Procedure Laterality Date   ABDOMINAL HYSTERECTOMY     COLONOSCOPY WITH PROPOFOL N/A 01/05/2018   Procedure: COLONOSCOPY WITH PROPOFOL;  Surgeon: Jonathon Bellows, MD;  Location: [facility] ENDOSCOPY;  Service: Gastroenterology;  Laterality: N/A;   Family History  Problem  Relation Age of Onset   Healthy Mother    Diabetes Father    Heart attack Sister    Diabetes Brother    Diabetes Sister    Stomach cancer Sister    Healthy Son    Healthy Daughter    Social History   Socioeconomic History   Marital status: Married    Spouse name: Not on file   Number of children: Not on file   Years of education: Not on file   Highest education level: High school graduate  Occupational History   Occupation: retired  Scientist, 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 all    Food insecurity    Worry: Never true    Inability: Never true   Transportation needs    Medical: No    Non-medical: No  Tobacco Use   Smoking status: Current Every Day Smoker    Packs/day: 0.25    Years: 40.00    Pack years: 10.00    Types: Cigarettes   Smokeless tobacco: Never Used   Tobacco comment: intermittent smoker over past 40 years, quit at time up to 6 months  Substance and Sexual Activity   Alcohol use: Never    Frequency: Never   Drug use: Never   Sexual activity: Not on file  Lifestyle   Physical activity    Days per week: 0 days    Minutes per session: 0 min   Stress: Not at all  Relationships   Social connections    Talks on phone: More than three times a week    Gets together: Once a week    Attends religious service: Never    Active member of club or organization: No    Attends meetings of clubs or organizations: Never    Relationship status: Married  Other Topics Concern   Not on file  Social History Narrative   Not on file    Outpatient Encounter Medications as of 07/07/2018  Medication Sig   amLODipine (NORVASC) 5 MG tablet Take 1 tablet (5 mg total) by mouth daily.   chlorthalidone (HYGROTON) 25 MG tablet TAKE 1 TABLET(25 MG) BY MOUTH DAILY   cyclobenzaprine (FLEXERIL) 10 MG tablet TAKE 1 TABLET(10 MG) BY MOUTH THREE TIMES DAILY AS NEEDED FOR MUSCLE SPASMS   gabapentin (NEURONTIN) 300 MG capsule TAKE 1 CAPSULE(300 MG) BY MOUTH THREE TIMES DAILY   glipiZIDE (GLUCOTROL) 10 MG tablet TAKE 1 TABLET(10 MG) BY MOUTH TWICE DAILY BEFORE A MEAL   losartan (COZAAR) 100 MG tablet TAKE 1 TABLET(100 MG) BY MOUTH DAILY   metFORMIN (GLUCOPHAGE-XR) 750 MG 24 hr tablet TAKE 1 TABLET BY MOUTH TWICE DAILY   ONETOUCH DELICA LANCETS 02V MISC 1 Device by Does not apply route daily.   ONETOUCH VERIO test strip Use 1 STRIP TO TEST BID UTD   rosuvastatin (CRESTOR) 40 MG tablet TAKE 1 TABLET(40 MG) BY MOUTH DAILY   VENTOLIN HFA 108 (90 Base) MCG/ACT  inhaler INHALE 1 TO 2 PUFFS BY MOUTH INTO THE LUNGS EVERY 6 HOURS AS NEEDED FOR SHORTNESS OF BREATH   [DISCONTINUED] metoprolol tartrate (LOPRESSOR) 50 MG tablet TAKE 1 TABLET(50 MG) BY MOUTH TWICE DAILY (Patient not taking: Reported on 07/07/2018)   [DISCONTINUED] phenazopyridine (PYRIDIUM) 200 MG tablet Take 1 tablet (200 mg total) by mouth 3 (three) times daily as needed for pain (limit use to 2 consecutive days before taking 1 day off.  May repeat prn.). (Patient not taking: Reported on 07/07/2018)   No facility-administered encounter medications  on file as of 07/07/2018.     Activities of Daily Living In your present state of health, do you have any difficulty performing the following activities: 07/07/2018  Hearing? N  Vision? N  Difficulty concentrating or making decisions? N  Walking or climbing stairs? N  Dressing or bathing? N  Doing errands, shopping? N  Preparing Food and eating ? N  Using the Toilet? N  In the past six months, have you accidently leaked urine? N  Do you have problems with loss of bowel control? N  Managing your Medications? N  Managing your Finances? N  Housekeeping or managing your Housekeeping? N  Some recent data might be hidden    Patient Care Team: Mikey College, NP as PCP - General (Nurse Practitioner)    Assessment:   This is a routine wellness examination for Alison Mays.  Exercise Activities and Dietary recommendations Current Exercise Habits: The patient does not participate in regular exercise at present, Exercise limited by: None identified  Goals     Quit Smoking     Smoking cessation discussed       Fall Risk: Fall Risk  07/07/2018 03/26/2018 07/01/2017  Falls in the past year? 0 0 Yes  Number falls in past yr: - 0 1  Injury with Fall? - - No  Follow up - Falls evaluation completed Falls prevention discussed    [place]:  Any stairs in or around the home? No  If so, are there any without  handrails? n/a  Home free of loose throw rugs in walkways, pet beds, electrical cords, etc? Yes  Adequate lighting in your home to reduce risk of falls? Yes   ASSISTIVE DEVICES UTILIZED TO PREVENT FALLS:  Life alert? No  Use of a cane, walker or w/c? No  Grab bars in the bathroom? No  Shower chair or bench in shower? No  Elevated toilet seat or a handicapped toilet? No   TIMED UP AND GO: Unable to perform   Depression Screen PHQ 2/9 Scores 07/07/2018 03/26/2018 07/01/2017 05/08/2017  PHQ - 2 Score 0 0 0 1  PHQ- 9 Score - - - 7     Cognitive Function     6CIT Screen 07/07/2018 07/01/2017  What Year? 0 points 0 points  What month? 0 points 0 points  What time? 0 points 0 points  Count back from 20 0 points 0 points  Months in reverse 0 points 0 points  Repeat phrase 0 points 0 points  Total Score 0 0     There is no immunization history on file for this patient.  Qualifies for Shingles Vaccine? Yes . Due for Shingrix. Education has been provided regarding the importance of this vaccine. Pt has been advised to call insurance company to determine out of pocket expense. Advised may also receive vaccine at local pharmacy or Health Dept. Verbalized acceptance and understanding.  Tdap:  Education has been provided regarding the importance of this vaccine. Advised may receive this vaccine at local pharmacy or Health Dept. Aware to provide a copy of the vaccination record if obtained from local pharmacy or Health Dept. Verbalized acceptance and understanding.  Flu Vaccine: up to date   Pneumococcal Vaccine: Due for Pneumococcal vaccine. Education has been provided regarding the importance of this vaccine but still declined. Advised may receive this vaccine at local pharmacy or Health Dept. Aware to provide a copy of the vaccination record if obtained from local pharmacy or Health Dept.  Verbalized acceptance and understanding.   Screening Tests Health Maintenance  Topic Date Due    Hepatitis C Screening  02/28/1950   OPHTHALMOLOGY EXAM  09/28/1961   TETANUS/TDAP  09/29/1970   DEXA SCAN  09/28/2016   PNA vac Low Risk Adult (1 of 2 - PCV13) 09/28/2016   FOOT EXAM  05/23/2018   INFLUENZA VACCINE  08/22/2018   HEMOGLOBIN A1C  08/31/2018   MAMMOGRAM  11/09/2018   COLONOSCOPY  01/06/2023    Cancer Screenings:  Colorectal Screening: Completed 01/05/2018. Repeat every 5 years  Mammogram: Completed 10/2016. Repeat every year. ordered  Bone Density: ordered  Lung Cancer Screening: (Low Dose CT Chest recommended if Age 84-80 years, 30 pack-year currently smoking OR have quit w/in 15years.) does not qualify.     Additional Screening:  Hepatitis C Screening: does qualify; Completed per pateint unsure date   Dental Screening: Recommended annual dental exams for proper oral hygiene   Community Resource Referral:  CRR required this visit?  No       Plan:  I have personally reviewed and addressed the Medicare Annual Wellness questionnaire and have noted the following in the patients chart:  A. Medical and social history B. Use of alcohol, tobacco or illicit drugs  C. Current medications and supplements D. Functional ability and status E.  Nutritional status F.  Physical activity G. Advance directives H. List of other physicians I.  Hospitalizations, surgeries, and ER visits in previous 12 months J.  [place] such as hearing and vision if needed, cognitive and depression L. Referrals and appointments   In addition, I have reviewed and discussed with patient certain preventive protocols, quality metrics, and best practice recommendations. A written personalized care plan for preventive services as well as general preventive health recommendations were provided to patient.   Signed,    Bevelyn Ngo, LPN  04/11/252 Nurse Health Advisor   Nurse Notes: none

## 2018-07-07 NOTE — Patient Instructions (Addendum)
Alison Mays , Thank you for taking time to come for your Medicare Wellness Visit. I appreciate your ongoing commitment to your health goals. Please review the following plan we discussed and let me know if I can assist you in the future.   Screening recommendations/referrals: Colonoscopy: completed 12/2017 Mammogram: Please call [phone] to schedule your mammogram.  Bone Density: Please call [phone] to schedule.  Recommended yearly ophthalmology/optometry visit for glaucoma screening and checkup Recommended yearly dental visit for hygiene and checkup  Vaccinations: Influenza vaccine: up to date  Pneumococcal vaccine: will get next time in office.  Tdap vaccine: due, check with your insurance company for coverage  Shingles vaccine: shingrix eligible, check with your insurance company for coverage  Advanced directives: please pick up a copy of this information from our office the next time you come in.   Conditions/risks identified: smoking cessation discussed.   Next appointment: follow up in one year for your annual wellness exam.    Preventive Care 65 Years and Older, Female Preventive care refers to lifestyle choices and visits with your health care provider that can promote health and wellness. What does preventive care include?  A yearly physical exam. This is also called an annual well check.  Dental exams once or twice a year.  Routine eye exams. Ask your health care provider how often you should have your eyes checked.  Personal lifestyle choices, including:  Daily care of your teeth and gums.  Regular physical activity.  Eating a healthy diet.  Avoiding tobacco and drug use.  Limiting alcohol use.  Practicing safe sex.  Taking low-dose aspirin every day.  Taking vitamin and mineral supplements as recommended by your health care provider. What happens during an annual well check? The services and screenings done by your health care provider during  your annual well check will depend on your age, overall health, lifestyle risk factors, and family history of disease. Counseling  Your health care provider may ask you questions about your:  Alcohol use.  Tobacco use.  Drug use.  Emotional well-being.  Home and relationship well-being.  Sexual activity.  Eating habits.  History of falls.  Memory and ability to understand (cognition).  Work and work Statistician.  Reproductive health. Screening  You may have the following tests or measurements:  Height, weight, and BMI.  Blood pressure.  Lipid and cholesterol levels. These may be checked every 5 years, or more frequently if you are over 8 years old.  Skin check.  Lung cancer screening. You may have this screening every year starting at age 93 if you have a 30-pack-year history of smoking and currently smoke or have quit within the past 15 years.  Fecal occult blood test (FOBT) of the stool. You may have this test every year starting at age 39.  Flexible sigmoidoscopy or colonoscopy. You may have a sigmoidoscopy every 5 years or a colonoscopy every 10 years starting at age 24.  Hepatitis C blood test.  Hepatitis B blood test.  Sexually transmitted disease (STD) testing.  Diabetes screening. This is done by checking your blood sugar (glucose) after you have not eaten for a while (fasting). You may have this done every 1-3 years.  Bone density scan. This is done to screen for osteoporosis. You may have this done starting at age 44.  Mammogram. This may be done every 1-2 years. Talk to your health care provider about how often you should have regular mammograms. Talk with your health care provider about your test  results, treatment options, and if necessary, the need for more tests. Vaccines  Your health care provider may recommend certain vaccines, such as:  Influenza vaccine. This is recommended every year.  Tetanus, diphtheria, and acellular pertussis (Tdap,  Td) vaccine. You may need a Td booster every 10 years.  Zoster vaccine. You may need this after age 93.  Pneumococcal 13-valent conjugate (PCV13) vaccine. One dose is recommended after age 16.  Pneumococcal polysaccharide (PPSV23) vaccine. One dose is recommended after age 33. Talk to your health care provider about which screenings and vaccines you need and how often you need them. This information is not intended to replace advice given to you by your health care provider. Make sure you discuss any questions you have with your health care provider. Document Released: 02/03/2015 Document Revised: 09/27/2015 Document Reviewed: 11/08/2014 Elsevier Interactive Patient Education  2017 [place] Prevention in the Home Falls can cause injuries. They can happen to people of all ages. There are many things you can do to make your home safe and to help prevent falls. What can I do on the outside of my home?  Regularly fix the edges of walkways and driveways and fix any cracks.  Remove anything that might make you trip as you walk through a door, such as a raised step or threshold.  Trim any bushes or trees on the path to your home.  Use bright outdoor lighting.  Clear any walking paths of anything that might make someone trip, such as rocks or tools.  Regularly check to see if handrails are loose or broken. Make sure that both sides of any steps have handrails.  Any raised decks and porches should have guardrails on the edges.  Have any leaves, snow, or ice cleared regularly.  Use sand or salt on walking paths during winter.  Clean up any spills in your garage right away. This includes oil or grease spills. What can I do in the bathroom?  Use night lights.  Install grab bars by the toilet and in the tub and shower. Do not use towel bars as grab bars.  Use non-skid mats or decals in the tub or shower.  If you need to sit down in the shower, use a plastic, non-slip stool.   Keep the floor dry. Clean up any water that spills on the floor as soon as it happens.  Remove soap buildup in the tub or shower regularly.  Attach bath mats securely with double-sided non-slip rug tape.  Do not have throw rugs and other things on the floor that can make you trip. What can I do in the bedroom?  Use night lights.  Make sure that you have a light by your bed that is easy to reach.  Do not use any sheets or blankets that are too big for your bed. They should not hang down onto the floor.  Have a firm chair that has side arms. You can use this for support while you get dressed.  Do not have throw rugs and other things on the floor that can make you trip. What can I do in the kitchen?  Clean up any spills right away.  Avoid walking on wet floors.  Keep items that you use a lot in easy-to-reach places.  If you need to reach something above you, use a strong step stool that has a grab bar.  Keep electrical cords out of the way.  Do not use floor polish or wax that makes  floors slippery. If you must use wax, use non-skid floor wax.  Do not have throw rugs and other things on the floor that can make you trip. What can I do with my stairs?  Do not leave any items on the stairs.  Make sure that there are handrails on both sides of the stairs and use them. Fix handrails that are broken or loose. Make sure that handrails are as long as the stairways.  Check any carpeting to make sure that it is firmly attached to the stairs. Fix any carpet that is loose or worn.  Avoid having throw rugs at the top or bottom of the stairs. If you do have throw rugs, attach them to the floor with carpet tape.  Make sure that you have a light switch at the top of the stairs and the bottom of the stairs. If you do not have them, ask someone to add them for you. What else can I do to help prevent falls?  Wear shoes that:  Do not have high heels.  Have rubber bottoms.  Are  comfortable and fit you well.  Are closed at the toe. Do not wear sandals.  If you use a stepladder:  Make sure that it is fully opened. Do not climb a closed stepladder.  Make sure that both sides of the stepladder are locked into place.  Ask someone to hold it for you, if possible.  Clearly mark and make sure that you can see:  Any grab bars or handrails.  First and last steps.  Where the edge of each step is.  Use tools that help you move around (mobility aids) if they are needed. These include:  Canes.  Walkers.  Scooters.  Crutches.  Turn on the lights when you go into a dark area. Replace any light bulbs as soon as they burn out.  Set up your furniture so you have a clear path. Avoid moving your furniture around.  If any of your floors are uneven, fix them.  If there are any pets around you, be aware of where they are.  Review your medicines with your doctor. Some medicines can make you feel dizzy. This can increase your chance of falling. Ask your doctor what other things that you can do to help prevent falls. This information is not intended to replace advice given to you by your health care provider. Make sure you discuss any questions you have with your health care provider. Document Released: 11/03/2008 Document Revised: 06/15/2015 Document Reviewed: 02/11/2014 Elsevier Interactive Patient Education  2017 [employer].

## 2018-08-14 ENCOUNTER — Other Ambulatory Visit: Payer: Self-pay | Admitting: Family Medicine

## 2018-08-14 DIAGNOSIS — G629 Polyneuropathy, unspecified: Secondary | ICD-10-CM

## 2018-08-29 ENCOUNTER — Other Ambulatory Visit: Payer: Self-pay | Admitting: Nurse Practitioner

## 2018-08-29 DIAGNOSIS — I10 Essential (primary) hypertension: Secondary | ICD-10-CM

## 2018-09-07 ENCOUNTER — Other Ambulatory Visit: Payer: Self-pay | Admitting: Nurse Practitioner

## 2018-09-07 DIAGNOSIS — E1142 Type 2 diabetes mellitus with diabetic polyneuropathy: Secondary | ICD-10-CM

## 2018-09-21 ENCOUNTER — Encounter: Payer: Self-pay | Admitting: Nurse Practitioner

## 2018-09-21 ENCOUNTER — Other Ambulatory Visit: Payer: Self-pay

## 2018-09-21 ENCOUNTER — Ambulatory Visit (INDEPENDENT_AMBULATORY_CARE_PROVIDER_SITE_OTHER): Payer: Medicare Other | Admitting: Nurse Practitioner

## 2018-09-21 DIAGNOSIS — G8929 Other chronic pain: Secondary | ICD-10-CM | POA: Diagnosis not present

## 2018-09-21 DIAGNOSIS — M25511 Pain in right shoulder: Secondary | ICD-10-CM

## 2018-09-21 MED ORDER — CYCLOBENZAPRINE HCL 10 MG PO TABS: 10.0000 mg | ORAL_TABLET | Freq: Three times a day (TID) | ORAL | 2 refills | Status: DC | PRN

## 2018-09-21 NOTE — Progress Notes (Signed)
Telemedicine Encounter: Disclosed to patient at start of encounter that we will provide appropriate telemedicine services.  Patient consents to be treated via phone prior to discussion. - Patient is at her home and is accessed via telephone. - Services are provided by [name] from [facility].  Subjective:    Patient ID: Alison Mays, female    DOB: [birth year], 68 y.o.   MRN: [id number]  Alison Mays is a 69 y.o. female presenting on 09/21/2018 for Neck Pain (Constant R side neck pain and shoulder  x 2 mths. The pain worsen with diferrent ROM.  Pt currently taking Aleve and Tylenol BID for the discomfort w/  some relief, but the pain doesn't resolve. )  HPI Right sided neck/shoulder Pain Patient notes pain started 2 months ago after remote injury.  Never healed fully after 11/2017 visit with me.  Is having a hard time brushing hair and teeth due to pain.  Patient is taking Aleve bid and Tylenol bid.  Continues using cyclobenzaprine without relief.      Pain Location: Back of shoulder into neck is worse.  Empty can test painful.  Full can test more painful.  Patient feels like arm is weak. "losing muscle strength"  Patient is not able to get arm straight to ear, but can reach up past her head.    Social History   Tobacco Use  . Smoking status: Current Every Day Smoker    Packs/day: 0.25    Years: 40.00    Pack years: 10.00    Types: Cigarettes  . Smokeless tobacco: Never Used  . Tobacco comment: intermittent smoker over past 40 years, quit at time up to 6 months  Substance Use Topics  . Alcohol use: Never    Frequency: Never  . Drug use: Never    Review of Systems Per HPI unless specifically indicated above     Objective:    There were no vitals taken for this visit.  Wt Readings from Last 3 Encounters:  03/26/18 182 lb 9.6 oz (82.8 kg)  02/25/18 187 lb 12.8 oz (85.2 kg)  01/19/18 181 lb 9.6 oz (82.4 kg)    Physical Exam Patient remotely  monitored.  Verbal communication appropriate.  Cognition normal.   Results for orders placed or performed in visit on 03/26/18  Urine Culture   Specimen: Urine  Result Value Ref Range   MICRO NUMBER: [id number]    SPECIMEN QUALITY: Adequate    Sample Source URINE    STATUS: FINAL    ISOLATE 1:      Multiple organisms present, each less than 10,000 CFU/mL. These organisms, commonly found on external and internal genitalia, are considered to be colonizers. No further testing performed.  POCT Urinalysis Dipstick  Result Value Ref Range   Color, UA yellow    Clarity, UA clear    Glucose, UA Negative Negative   Bilirubin, UA Negative    Ketones, UA Negative    Spec Grav, UA 1.025 1.010 - 1.025   Blood, UA trace    pH, UA 6.0 5.0 - 8.0   Protein, UA Positive (A) Negative   Urobilinogen, UA 1.0 0.2 or 1.0 E.U./dL   Nitrite, UA Negative    Leukocytes, UA Trace (A) Negative   Appearance     Odor        Assessment & Plan:   Problem List Items Addressed This Visit    None    Visit Diagnoses    Chronic right  shoulder pain    -  Primary   Relevant Medications   cyclobenzaprine (FLEXERIL) 10 MG tablet   Other Relevant Orders   Ambulatory referral to Orthopedic Surgery      Pain likely NOT self-limited due to chronicity and progression of severity of pain/limitations now in ROM.  Likely rotator cuff injury due to descriptions of ROM activities with remote evaluation. Evaluated initially in 11/ 2019 with same, but patient was not able to complete physical therapy.  Now with progression, PT first is not the appropriate course of treatment.  Plan:  1. Treat with OTC pain meds (acetaminophen and naproxen sodium).  Discussed alternate dosing and max dosing. 2. Apply heat and/or ice to affected area. 3. May also apply a muscle rub with lidocaine or lidocaine patch after heat or ice. 4. Take muscle relaxer cyclobenzaprine 10 mg up to three times daily.  Cautioned drowsiness. 5. Referral to  orthopedics - patient prefers [facility] location closer to [place] where she lives 6. Follow up 2-4 weeks prn.   Meds ordered this encounter  Medications  . cyclobenzaprine (FLEXERIL) 10 MG tablet    Sig: Take 1 tablet (10 mg total) by mouth 3 (three) times daily as needed for muscle spasms.    Dispense:  60 tablet    Refill:  2    Order Specific Question:   Supervising Provider    Answer:   Olin Hauser [[id number]]   - Time spent in direct consultation with patient via telemedicine about above concerns: 9 minutes  Follow up plan: Follow-up 2-4 weeks prn.  [name], DNP, AGPCNP-BC Adult Gerontology Primary Care Nurse Practitioner [facility] 09/21/2018, 8:07 AM

## 2018-09-23 ENCOUNTER — Other Ambulatory Visit: Payer: Self-pay | Admitting: Nurse Practitioner

## 2018-09-23 DIAGNOSIS — G629 Polyneuropathy, unspecified: Secondary | ICD-10-CM

## 2018-09-23 NOTE — Telephone Encounter (Signed)
Patient needs T2DM, lipid, hypertension follow-up in next 30 days.

## 2018-10-07 LAB — HM DIABETES EYE EXAM

## 2018-10-11 ENCOUNTER — Other Ambulatory Visit: Payer: Self-pay | Admitting: Nurse Practitioner

## 2018-10-11 DIAGNOSIS — I10 Essential (primary) hypertension: Secondary | ICD-10-CM

## 2018-10-17 ENCOUNTER — Other Ambulatory Visit: Payer: Self-pay | Admitting: Family Medicine

## 2018-10-17 ENCOUNTER — Other Ambulatory Visit: Payer: Self-pay | Admitting: Nurse Practitioner

## 2018-10-17 DIAGNOSIS — E1142 Type 2 diabetes mellitus with diabetic polyneuropathy: Secondary | ICD-10-CM

## 2018-10-17 DIAGNOSIS — G629 Polyneuropathy, unspecified: Secondary | ICD-10-CM

## 2018-11-17 ENCOUNTER — Other Ambulatory Visit: Payer: Self-pay | Admitting: Family Medicine

## 2018-11-17 ENCOUNTER — Other Ambulatory Visit: Payer: Self-pay | Admitting: Nurse Practitioner

## 2018-11-17 DIAGNOSIS — I10 Essential (primary) hypertension: Secondary | ICD-10-CM

## 2018-11-17 DIAGNOSIS — E782 Mixed hyperlipidemia: Secondary | ICD-10-CM

## 2018-11-17 DIAGNOSIS — E1142 Type 2 diabetes mellitus with diabetic polyneuropathy: Secondary | ICD-10-CM

## 2018-11-17 DIAGNOSIS — G629 Polyneuropathy, unspecified: Secondary | ICD-10-CM

## 2018-11-18 ENCOUNTER — Other Ambulatory Visit: Payer: Self-pay | Admitting: Family Medicine

## 2018-11-18 DIAGNOSIS — I10 Essential (primary) hypertension: Secondary | ICD-10-CM

## 2018-11-19 ENCOUNTER — Other Ambulatory Visit: Payer: Self-pay | Admitting: Family Medicine

## 2018-11-19 ENCOUNTER — Other Ambulatory Visit: Payer: Self-pay | Admitting: Nurse Practitioner

## 2018-11-19 DIAGNOSIS — I10 Essential (primary) hypertension: Secondary | ICD-10-CM

## 2018-12-19 ENCOUNTER — Other Ambulatory Visit: Payer: Self-pay | Admitting: Family Medicine

## 2018-12-19 DIAGNOSIS — I10 Essential (primary) hypertension: Secondary | ICD-10-CM

## 2019-01-25 ENCOUNTER — Other Ambulatory Visit: Payer: Self-pay | Admitting: Family Medicine

## 2019-01-25 DIAGNOSIS — G629 Polyneuropathy, unspecified: Secondary | ICD-10-CM

## 2019-01-25 DIAGNOSIS — E1142 Type 2 diabetes mellitus with diabetic polyneuropathy: Secondary | ICD-10-CM

## 2019-01-25 DIAGNOSIS — G8929 Other chronic pain: Secondary | ICD-10-CM

## 2019-02-09 ENCOUNTER — Encounter: Payer: Self-pay | Admitting: Family Medicine

## 2019-02-09 ENCOUNTER — Ambulatory Visit (INDEPENDENT_AMBULATORY_CARE_PROVIDER_SITE_OTHER): Payer: Medicare Other | Admitting: Family Medicine

## 2019-02-09 ENCOUNTER — Other Ambulatory Visit: Payer: Self-pay

## 2019-02-09 VITALS — BP 115/61 | HR 101 | Temp 97.7°F | Resp 16 | Ht 61.0 in | Wt 189.0 lb

## 2019-02-09 DIAGNOSIS — E1142 Type 2 diabetes mellitus with diabetic polyneuropathy: Secondary | ICD-10-CM

## 2019-02-09 LAB — POCT GLYCOSYLATED HEMOGLOBIN (HGB A1C): Hemoglobin A1C: 8.2 % — AB (ref 4.0–5.6)

## 2019-02-09 MED ORDER — OZEMPIC (0.25 OR 0.5 MG/DOSE) 2 MG/1.5ML ~~LOC~~ SOPN: 0.2500 mg | PEN_INJECTOR | SUBCUTANEOUS | 2 refills | Status: DC

## 2019-02-09 NOTE — Progress Notes (Signed)
Subjective:    Patient ID: Alison Mays, female    DOB: 1950-03-20, 69 y.o.   MRN: [id number]  Alison Mays is a 69 y.o. female presenting on 02/09/2019 for Foot Pain (Left side onset month)   HPI   CHRONIC DM, Type 2 with diabetic neuropathy Reports concerns with nerve damage in feet with Left side significantly worse than Right with nerve pain tingling and warmth and swelling worse in toes, great toe worse, and forefoot. CBGs: Avg 120s, Low >100, High < 200. Meds: Metformin XR 750mg  BID, and Glipizide 10mg  BID - She has been on Metformin for a long time, and asking about change of medicine. Reports good compliance. Tolerating well w/o side-effects Currently on ARB Lifestyle: - not adhering to diabetic lifestyle currently goal to improve diet and exercise now more Denies hypoglycemia, polyuria, visual changes, numbness or tingling.  Health Maintenance: Due for Flu Shot, declines today despite counseling on benefits   Depression screen [facility] 2/9 02/09/2019 07/07/2018 03/26/2018  Decreased Interest 0 0 0  Down, Depressed, Hopeless 0 0 0  PHQ - 2 Score 0 0 0  Altered sleeping - - -  Tired, decreased energy - - -  Change in appetite - - -  Feeling bad or failure about yourself  - - -  Trouble concentrating - - -  Moving slowly or fidgety/restless - - -  Suicidal thoughts - - -  PHQ-9 Score - - -  Difficult doing work/chores - - -    Social History   Tobacco Use  . Smoking status: Current Every Day Smoker    Packs/day: 0.25    Years: 40.00    Pack years: 10.00    Types: Cigarettes  . Smokeless tobacco: Current User  . Tobacco comment: intermittent smoker over past 40 years, quit at time up to 6 months  Substance Use Topics  . Alcohol use: Never  . Drug use: Never    Review of Systems Per HPI unless specifically indicated above     Objective:    BP 115/61   Pulse (!) 101   Temp 97.7 F (36.5 C) (Oral)   Resp 16   Ht 5\' 1"  (1.549 m)   Wt 189 lb (85.7 kg)    BMI 35.71 kg/m   Wt Readings from Last 3 Encounters:  02/09/19 189 lb (85.7 kg)  03/26/18 182 lb 9.6 oz (82.8 kg)  02/25/18 187 lb 12.8 oz (85.2 kg)    Physical Exam Vitals and nursing note reviewed.  Constitutional:      General: She is not in acute distress.    Appearance: She is well-developed. She is not diaphoretic.     Comments: Well-appearing, comfortable, cooperative  HENT:     Head: Normocephalic and atraumatic.  Eyes:     General:        Right eye: No discharge.        Left eye: No discharge.     Conjunctiva/sclera: Conjunctivae normal.  Cardiovascular:     Rate and Rhythm: Normal rate.  Pulmonary:     Effort: Pulmonary effort is normal.  Skin:    General: Skin is warm and dry.     Findings: No erythema or rash.  Neurological:     Mental Status: She is alert and oriented to person, place, and time.  Psychiatric:        Behavior: Behavior normal.     Comments: Well groomed, good eye contact, normal speech and thoughts  Recent Labs    03/02/18 1359 02/09/19 1802  HGBA1C 7.2* 8.2*     Diabetic Foot Exam - Simple   Simple Foot Form Diabetic Foot exam was performed with the following findings: Yes 02/09/2019  3:16 PM  Visual Inspection See comments: Yes Sensation Testing Intact to touch and monofilament testing bilaterally: Yes Pulse Check Posterior Tibialis and Dorsalis pulse intact bilaterally: Yes Comments Left foot with soft nodule middle of plantar foot non tender, has some swelling forefoot and callus formation.     Results for orders placed or performed in visit on 10/21/18  HM DIABETES EYE EXAM  Result Value Ref Range   HM Diabetic Eye Exam No Retinopathy No Retinopathy      Assessment & Plan:   Problem List Items Addressed This Visit    Type 2 diabetes mellitus with peripheral neuropathy ([place]) - Primary   Relevant Medications   OZEMPIC, 0.25 OR 0.5 MG/DOSE, 2 MG/1.5ML SOPN   Other Relevant Orders   POCT glycosylated hemoglobin  (Hb A1C)   Ambulatory referral to Podiatry      Sub optimal control A1c up to 8.2 today, hyperglycemia some lifestyle changes Complications - peripheral neuropathy, hyperlipidemia, hyperglycemia  Plan:  1. Discuss new DM regimen today - DC Glipizide - START Ozempic 0.25mg  weekly inj GLP1 - sample given, review benefit risk side effect, demo shown today, sample given in office, good for 6 week doses, 0.25 x 4 doses over 4 weeks, then inc to 0.5 weekly x 2 doses, will need new rx for Ozempic 0.5 mg weekly inj after that and in future eventually up to 1mg  >3 months - May continue Metformin XR 750 x 2 daily for now, in future can reduce once adjusted to ozempic, or discontinue eventually 2. Encourage improved lifestyle - low carb, low sugar diet, reduce portion size, continue improving regular exercise 3. Check CBG , bring log to next visit for review 4. Continue ARB, Statin 5. DM Foot exam done today   Referral to Podiatry [facility] now due to worsening neuropathy  She may increase current Gabapentin from 300 TID up to 300 - 300 - 600 (x 2 pills in PM) then eventually max dose is 600 TID, can write new rx when requested  Orders Placed This Encounter  Procedures  . Ambulatory referral to Podiatry    Referral Priority:   Routine    Referral Type:   Consultation    Referral Reason:   Specialty Services Required    Requested Specialty:   Podiatry    Number of Visits Requested:   1  . POCT glycosylated hemoglobin (Hb A1C)     Meds ordered this encounter  Medications  . OZEMPIC, 0.25 OR 0.5 MG/DOSE, 2 MG/1.5ML SOPN    Sig: Inject 0.25 mg into the skin once a week. For first 4 weeks. Then increase dose to 0.5mg  weekly    Dispense:  1 pen    Refill:  2      Follow up plan: Return in about 3 months (around 05/10/2019) for 3 month DM A1c, neuropathy w./ new provider.   Nobie Putnam, [facility] 02/09/2019, 3:10 PM

## 2019-02-09 NOTE — Patient Instructions (Addendum)
Thank you for coming to the office today.  Increase gabapentin for nerve pain - start with 1 in AM 1 in afternoon and 2 in PM - gradually increase to a maximum dose of 2 pills 3 times a day, before you run out let us know and we can re order.  Referral to foot doctor  [place] Address: [address], [place], [place] [address] Hours: Open 8AM-5PM Phone: [phone]  Recent Labs    03/02/18 1359  HGBA1C 7.2*    Sample - good for 6 weeks - once weekly injection, notify us when ready for [place] to order it and we can send new rx if, if not covered we can switch to a different version.  Ozempic (Semaglutide injection) - start 0.25mg  weekly for 4 weeks then increase to 0.5mg  weekly - This one has best benefit of weight loss and reducing Cardiovascular events    Please schedule a Follow-up Appointment to: Return in about 3 months (around 05/10/2019) for 3 month DM A1c, neuropathy w./ new provider.  If you have any other questions or concerns, please feel free to call the office or send a message through [facility]. You may also schedule an earlier appointment if necessary.  Additionally, you may be receiving a survey about your experience at our office within a few days to 1 week by e-mail or mail. We value your feedback.  Nobie Putnam, DO [place]

## 2019-02-15 ENCOUNTER — Telehealth: Payer: Self-pay

## 2019-02-15 NOTE — Telephone Encounter (Signed)
Last visit was 02/09/19  She was advised to HOLD Glipizide 10mg  twice a day, as she started new med Ozempic at low dose 0.25mg  weekly for 4 weeks, then titrate up to 0.5mg   I called her 02/15/19. Reviewed CBGs. Most likely she had a noticeable elevated CBG after stopping Glipizide completely.  I advised her that it is safe to resume Glipizide 10mg  BID for now, as long as she monitors CBGs and after 2-6 weeks if Ozempic reaches higher dose and she gets used to that medicine then she will likely need to start to REDUCE Glipizide either to ONCE daily with largest meal and then eventually phase OFF of it. She can contact us with questions if need.  Nobie Putnam, [facility] 02/15/2019, 4:49 PM

## 2019-02-15 NOTE — Telephone Encounter (Signed)
As per patient she had started Ozempic last week on Wednesday but her fasting sugar still ranges from 200-205 mg/dL wanted to let provider know the readings,  which is higher as compare to the one in past. Please suggest ?

## 2019-02-17 ENCOUNTER — Other Ambulatory Visit: Payer: Self-pay | Admitting: Family Medicine

## 2019-02-17 DIAGNOSIS — I10 Essential (primary) hypertension: Secondary | ICD-10-CM

## 2019-02-17 DIAGNOSIS — E782 Mixed hyperlipidemia: Secondary | ICD-10-CM

## 2019-02-17 DIAGNOSIS — E1142 Type 2 diabetes mellitus with diabetic polyneuropathy: Secondary | ICD-10-CM

## 2019-02-18 ENCOUNTER — Ambulatory Visit: Payer: Self-pay | Admitting: Podiatry

## 2019-03-10 ENCOUNTER — Other Ambulatory Visit: Payer: Self-pay | Admitting: Nurse Practitioner

## 2019-03-10 DIAGNOSIS — E1142 Type 2 diabetes mellitus with diabetic polyneuropathy: Secondary | ICD-10-CM

## 2019-03-10 MED ORDER — OZEMPIC (0.25 OR 0.5 MG/DOSE) 2 MG/1.5ML ~~LOC~~ SOPN: 0.2500 mg | PEN_INJECTOR | SUBCUTANEOUS | 2 refills | Status: DC

## 2019-03-10 NOTE — Telephone Encounter (Signed)
Pt called requesting refill on ozempic [facility]  In [place]

## 2019-03-15 ENCOUNTER — Ambulatory Visit (INDEPENDENT_AMBULATORY_CARE_PROVIDER_SITE_OTHER): Payer: Medicare Other | Admitting: Family Medicine

## 2019-03-15 ENCOUNTER — Ambulatory Visit: Payer: Self-pay | Admitting: Family Medicine

## 2019-03-15 ENCOUNTER — Other Ambulatory Visit: Payer: Self-pay

## 2019-03-15 ENCOUNTER — Encounter: Payer: Self-pay | Admitting: Family Medicine

## 2019-03-15 VITALS — BP 142/80 | HR 81 | Temp 97.0°F | Ht 61.0 in | Wt 186.8 lb

## 2019-03-15 DIAGNOSIS — M25511 Pain in right shoulder: Secondary | ICD-10-CM | POA: Diagnosis not present

## 2019-03-15 DIAGNOSIS — I10 Essential (primary) hypertension: Secondary | ICD-10-CM | POA: Diagnosis not present

## 2019-03-15 DIAGNOSIS — E1142 Type 2 diabetes mellitus with diabetic polyneuropathy: Secondary | ICD-10-CM | POA: Diagnosis not present

## 2019-03-15 DIAGNOSIS — E669 Obesity, unspecified: Secondary | ICD-10-CM | POA: Diagnosis not present

## 2019-03-15 DIAGNOSIS — G8929 Other chronic pain: Secondary | ICD-10-CM

## 2019-03-15 DIAGNOSIS — E782 Mixed hyperlipidemia: Secondary | ICD-10-CM | POA: Diagnosis not present

## 2019-03-15 LAB — POCT UA - MICROALBUMIN: Microalbumin Ur, POC: NEGATIVE mg/L

## 2019-03-15 LAB — POCT URINALYSIS DIPSTICK
Bilirubin, UA: NEGATIVE
Blood, UA: NEGATIVE
Glucose, UA: NEGATIVE
Ketones, UA: NEGATIVE
Leukocytes, UA: NEGATIVE
Nitrite, UA: NEGATIVE
Protein, UA: NEGATIVE
Spec Grav, UA: 1.01 (ref 1.010–1.025)
Urobilinogen, UA: 0.2 E.U./dL
pH, UA: 5 (ref 5.0–8.0)

## 2019-03-15 MED ORDER — OZEMPIC (0.25 OR 0.5 MG/DOSE) 2 MG/1.5ML ~~LOC~~ SOPN: 0.2500 mg | PEN_INJECTOR | SUBCUTANEOUS | 2 refills | Status: DC

## 2019-03-15 MED ORDER — BD PEN NEEDLE NANO 2ND GEN 32G X 4 MM MISC: 1 refills | Status: AC

## 2019-03-15 MED ORDER — CYCLOBENZAPRINE HCL 10 MG PO TABS: ORAL_TABLET | ORAL | 2 refills | Status: DC

## 2019-03-15 NOTE — Progress Notes (Signed)
Subjective:    Patient ID: Alison Mays, female    DOB: [birth year], 69 y.o.   MRN: [id number]  Alison Mays is a 69 y.o. female presenting on 03/15/2019 for Diabetes, Hypertension, and Hyperlipidemia   HPI  Alison Mays presents to clinic for a follow up visit for her diabetes.  States she started Ozempic approximately 1 month ago, has been tolerating medications well, has found that her glucose readings have been a bit elevated around 180-200s.  No glucose log available at appointment today.  Has been having right shoulder pain that reports has been told in the past was a right rotator cuff tear.  Has not yet been to physical therapy for this.  Has been taking cyclobenzaprine as needed for this right shoulder pain, tolerating well.    Depression screen [facility] 2/9 02/09/2019 07/07/2018 03/26/2018  Decreased Interest 0 0 0  Down, Depressed, Hopeless 0 0 0  PHQ - 2 Score 0 0 0  Altered sleeping - - -  Tired, decreased energy - - -  Change in appetite - - -  Feeling bad or failure about yourself  - - -  Trouble concentrating - - -  Moving slowly or fidgety/restless - - -  Suicidal thoughts - - -  PHQ-9 Score - - -  Difficult doing work/chores - - -    Social History   Tobacco Use  . Smoking status: Current Every Day Smoker    Packs/day: 0.25    Years: 40.00    Pack years: 10.00    Types: Cigarettes  . Smokeless tobacco: Never Used  . Tobacco comment: intermittent smoker over past 40 years, quit at time up to 6 months  Substance Use Topics  . Alcohol use: Never  . Drug use: Never    Review of Systems  Constitutional: Negative.  Negative for activity change, appetite change, fatigue and fever.  HENT: Negative.   Eyes: Negative.   Respiratory: Negative.   Cardiovascular: Negative.   Gastrointestinal: Negative.   Endocrine: Negative.   Genitourinary: Negative.   Musculoskeletal: Positive for arthralgias (Right shoulder pain).  Skin: Negative.   Allergic/Immunologic:  Negative.   Neurological: Positive for numbness (Mild neuropathy bilateral feet).  Hematological: Negative.   Psychiatric/Behavioral: Negative.    Diabetic Review of Systems - medication compliance: compliant all of the time, diabetic diet compliance: compliant all of the time, home glucose monitoring: is performed regularly, last eye exam approximately 1 ago Per HPI unless specifically indicated above     Objective:    BP (!) 142/80 (BP Location: Left Arm, Patient Position: Sitting, Cuff Size: Normal)   Pulse 81   Temp (!) 97 F (36.1 C) (Oral)   Ht 5\' 1"  (1.549 m)   Wt 186 lb 12.8 oz (84.7 kg)   BMI 35.30 kg/m   Wt Readings from Last 3 Encounters:  03/15/19 186 lb 12.8 oz (84.7 kg)  02/09/19 189 lb (85.7 kg)  03/26/18 182 lb 9.6 oz (82.8 kg)    Physical Exam Vitals reviewed.  Constitutional:      General: She is not in acute distress.    Appearance: Normal appearance. She is well-groomed. She is obese. She is not ill-appearing.  HENT:     Head: Normocephalic.     Right Ear: Hearing normal.     Left Ear: Hearing normal.  Eyes:     General: Lids are normal. Vision grossly intact.        Right eye: No discharge.  Left eye: No discharge.     Extraocular Movements: Extraocular movements intact.     Conjunctiva/sclera: Conjunctivae normal.     Pupils: Pupils are equal, round, and reactive to light.  Cardiovascular:     Rate and Rhythm: Normal rate and regular rhythm.     Pulses: Normal pulses.          Dorsalis pedis pulses are 2+ on the right side and 2+ on the left side.       Posterior tibial pulses are 2+ on the right side and 2+ on the left side.     Heart sounds: Normal heart sounds. No murmur. No friction rub. No gallop.   Pulmonary:     Effort: Pulmonary effort is normal. No respiratory distress.     Breath sounds: Normal breath sounds.  Abdominal:     General: Abdomen is flat. Bowel sounds are normal. There is no distension.     Palpations: Abdomen is  soft.     Tenderness: There is no abdominal tenderness. There is no guarding.  Musculoskeletal:        General: Tenderness (Right shoulder ) present. No swelling or deformity.     Right shoulder: Tenderness present. No swelling, deformity or effusion. Normal range of motion. Normal strength.     Left shoulder: Normal.     Cervical back: Normal range of motion.     Right lower leg: No edema.     Left lower leg: No edema.     Right foot: Normal range of motion. No deformity, bunion, Charcot foot or foot drop.     Left foot: Normal range of motion. No deformity, bunion, Charcot foot or foot drop.     Comments: Tenderness right rotator cuff  Feet:     Right foot:     Skin integrity: Skin integrity normal.     Left foot:     Skin integrity: Skin integrity normal.     Comments: Mild neuropathy with monofilament exam Skin:    General: Skin is warm and dry.     Capillary Refill: Capillary refill takes less than 2 seconds.     Nails: There is no clubbing.  Neurological:     Mental Status: She is alert and oriented to person, place, and time.     Cranial Nerves: Cranial nerves are intact. No cranial nerve deficit.     Sensory: Sensation is intact. No sensory deficit.     Motor: Motor function is intact. No weakness.     Coordination: Coordination is intact. Coordination normal.     Gait: Gait is intact. Gait normal.     Deep Tendon Reflexes: Reflexes normal.  Psychiatric:        Attention and Perception: Attention and perception normal.        Mood and Affect: Mood and affect normal.        Speech: Speech normal.        Behavior: Behavior normal. Behavior is cooperative.        Thought Content: Thought content normal.        Cognition and Memory: Cognition and memory normal.        Judgment: Judgment normal.    Results for orders placed or performed in visit on 03/15/19  POCT Urinalysis Dipstick  Result Value Ref Range   Color, UA yellow    Clarity, UA clear    Glucose, UA Negative  Negative   Bilirubin, UA Negative    Ketones, UA Negative    Spec  Grav, UA 1.010 1.010 - 1.025   Blood, UA Negative    pH, UA 5.0 5.0 - 8.0   Protein, UA Negative Negative   Urobilinogen, UA 0.2 0.2 or 1.0 E.U./dL   Nitrite, UA Negative    Leukocytes, UA Negative Negative   Appearance     Odor    POCT UA - Microalbumin  Result Value Ref Range   Microalbumin Ur, POC negative mg/L   Creatinine, POC     Albumin/Creatinine Ratio, Urine, POC        Assessment & Plan:   Problem List Items Addressed This Visit      Cardiovascular and Mediastinum   Essential hypertension    Blood pressure reasonably controlled at 142/80 in clinic today.  Has been taking and tolerating medications well.  Blood pressure goal still remains less than 130/80.  Plan: 1. Continue taking Amlodipine 5mg , Losartan 100mg , and Metoprolol as prescribed. 2. To have labs drawn, orders placed today and will return on a morning that she is fasting. 3. Encouraged heart healthy diet that is low glycemic and increasing exercise to every other day, going no more than 2 days in a row without exercising. 4. Continue to check your blood pressure at home, if remains elevated over 130/80 to contact us and we will schedule a telemedicine visit sooner than your 3 month follow up to discuss. 5. Follow up in clinic in 3 months.      Relevant Orders   POCT Urinalysis Dipstick (Completed)   POCT UA - Microalbumin (Completed)   CBC with Differential   Comprehensive Metabolic Panel (CMET)     Endocrine   Type 2 diabetes mellitus with peripheral neuropathy ([place]) - Primary    Questionably controlled DM with last A1C 8.2% increased from 7.2% (02/2018).  Has hyperlipidemia with no recent labs and some mild peripheral neuropathy, which she has been taking Gabapentin 3-4x a day as needed for neuropathy, tolerating well with good relief.  Plan: 1.  Continue current medication therapy of Neurontin 300mg  PO 3-4x a day as needed 2.  Continue Metformin XR 750mg  daily and Ozempic 0.5mg  weekly injection. 3. Increase dietary control of diabetes with low carb/low glycemic index diet.  Aim to exercise daily up to 30 minutes per day daily, going no more than 2 days in a row without exercising.   4. Have labs drawn within the next 2 weeks and will call with results. 5. Continue Rosuvastatin daily. 6. Keep log of blood glucose readings and bring with you to your next appointment. 7.  We will see you back in 3 months for a diabetic follow up and A1C recheck.      Relevant Medications   OZEMPIC, 0.25 OR 0.5 MG/DOSE, 2 MG/1.5ML SOPN   cyclobenzaprine (FLEXERIL) 10 MG tablet   Insulin Pen Needle (BD PEN NEEDLE NANO 2ND GEN) 32G X 4 MM MISC   Other Relevant Orders   POCT Urinalysis Dipstick (Completed)   POCT UA - Microalbumin (Completed)   CBC with Differential   Comprehensive Metabolic Panel (CMET)     Other   Right shoulder pain (Chronic)    Right shoulder pain that has been reported as being chronic.  States was told that she had a right rotator cuff tear, has not been to physical therapy but is interested in having a session or two to learn some exercises to help with strengthening the muscles around the right shoulder to avoid any possible surgery.   Plan: 1.  Will send to  physical therapy for assessment and home exercise plan 2.  Will follow up on this in 3 months for re-evaluation or if needs to go see an Orthopedic for further treatment.       Relevant Medications   cyclobenzaprine (FLEXERIL) 10 MG tablet   Other Relevant Orders   Ambulatory referral to Physical Therapy   Hyperlipidemia    See T2DM A/P.      Relevant Orders   POCT Urinalysis Dipstick (Completed)   Lipid Profile    Other Visit Diagnoses    Obesity (BMI 30-39.9)       Relevant Medications   OZEMPIC, 0.25 OR 0.5 MG/DOSE, 2 MG/1.5ML SOPN   Other Relevant Orders   Thyroid Panel With TSH      Meds ordered this encounter  Medications  .  OZEMPIC, 0.25 OR 0.5 MG/DOSE, 2 MG/1.5ML SOPN    Sig: Inject 0.25 mg into the skin once a week. For first 4 weeks. Then increase dose to 0.5mg  weekly    Dispense:  1 pen    Refill:  2    Order Specific Question:   Supervising Provider    Answer:   Olin Hauser [[id number]]  . cyclobenzaprine (FLEXERIL) 10 MG tablet    Sig: TAKE 1 TABLET(10 MG) BY MOUTH THREE TIMES DAILY AS NEEDED FOR MUSCLE SPASMS    Dispense:  60 tablet    Refill:  2    Order Specific Question:   Supervising Provider    Answer:   Olin Hauser [[id number]]  . Insulin Pen Needle (BD PEN NEEDLE NANO 2ND GEN) 32G X 4 MM MISC    Sig: To apply to Ozempic pen needle weekly.    Dispense:  30 each    Refill:  1    Order Specific Question:   Supervising Provider    Answer:   Olin Hauser [[id number]]    Diabetes Mellitus: needs improvement  Follow up plan: Return in about 3 months (around 06/12/2019) for DM, A1C, HTN f/u.  Harlin Rain, [facility] Nurse Practitioner [facility] 03/15/2019, 11:04 AM

## 2019-03-15 NOTE — Progress Notes (Signed)
I have reviewed this encounter including the documentation in this note and/or discussed this patient with the provider, Cyndia Skeeters FNP. I am certifying that I agree with the content of this note as supervising physician.  Nobie Putnam, DO [facility] 03/15/2019, 1:06 PM

## 2019-03-15 NOTE — Assessment & Plan Note (Addendum)
Questionably controlled DM with last A1C 8.2% increased from 7.2% (02/2018).  Has hyperlipidemia with no recent labs and some mild peripheral neuropathy, which she has been taking Gabapentin 3-4x a day as needed for neuropathy, tolerating well with good relief.  Plan: 1.  Continue current medication therapy of Neurontin 300mg  PO 3-4x a day as needed 2. Continue Metformin XR 750mg  daily and Ozempic 0.5mg  weekly injection. 3. Increase dietary control of diabetes with low carb/low glycemic index diet.  Aim to exercise daily up to 30 minutes per day daily, going no more than 2 days in a row without exercising.   4. Have labs drawn within the next 2 weeks and will call with results. 5. Continue Rosuvastatin daily. 6. Keep log of blood glucose readings and bring with you to your next appointment. 7.  We will see you back in 3 months for a diabetic follow up and A1C recheck.

## 2019-03-15 NOTE — Assessment & Plan Note (Signed)
Right shoulder pain that has been reported as being chronic.  States was told that she had a right rotator cuff tear, has not been to physical therapy but is interested in having a session or two to learn some exercises to help with strengthening the muscles around the right shoulder to avoid any possible surgery.   Plan: 1.  Will send to physical therapy for assessment and home exercise plan 2.  Will follow up on this in 3 months for re-evaluation or if needs to go see an Orthopedic for further treatment.

## 2019-03-15 NOTE — Assessment & Plan Note (Signed)
See T2DM A/P 

## 2019-03-15 NOTE — Patient Instructions (Signed)
I have put in lab orders for you to complete on a morning that you have been fasting.  I will call you with these results once we receive them.  We will plan to see you back in clinic in 3 months for a diabetes, hypertension and hyperlipidemia follow up visit.  Plan to exercise a minimum of 30 minutes per day at least 5 days per week as well as adequate water intake all while measuring blood pressure a few times per week with adequate goals discussed.  Look into DASH and Mediterranean diet options, avoiding processed foods, lowering sodium intake, avoiding pork products, and eating a plant based diet for optimal health.  Continue to have yearly dilated eye exams, daily feet check, and follow a heart healthy diet (low fat, low salt, low carb and protein control) along with daily exercise.  Eat a plant based diet for optimal health.  Avoid juices, sodas, rice, pasta, potatoes and bread and to get at least 30 minutes of exercise 5x a week.    You will receive a survey after today's visit either digitally by e-mail or paper by [employer]. Your experiences and feedback matter to [place].  Please respond so we know how we are doing as we provide care for you.  Call us with any questions/concerns/needs.  It is my goal to be available to you for your health concerns.  Thanks for choosing me to be a partner in your healthcare needs!  Harlin Rain, FNP-C Family Nurse Practitioner [facility] Phone: [phone]

## 2019-03-15 NOTE — Assessment & Plan Note (Signed)
Blood pressure reasonably controlled at 142/80 in clinic today.  Has been taking and tolerating medications well.  Blood pressure goal still remains less than 130/80.  Plan: 1. Continue taking Amlodipine 5mg , Losartan 100mg , and Metoprolol as prescribed. 2. To have labs drawn, orders placed today and will return on a morning that she is fasting. 3. Encouraged heart healthy diet that is low glycemic and increasing exercise to every other day, going no more than 2 days in a row without exercising. 4. Continue to check your blood pressure at home, if remains elevated over 130/80 to contact us and we will schedule a telemedicine visit sooner than your 3 month follow up to discuss. 5. Follow up in clinic in 3 months.

## 2019-03-16 ENCOUNTER — Other Ambulatory Visit: Payer: Medicare Other

## 2019-03-18 DIAGNOSIS — E1142 Type 2 diabetes mellitus with diabetic polyneuropathy: Secondary | ICD-10-CM | POA: Diagnosis not present

## 2019-03-18 DIAGNOSIS — I10 Essential (primary) hypertension: Secondary | ICD-10-CM | POA: Diagnosis not present

## 2019-03-18 DIAGNOSIS — E782 Mixed hyperlipidemia: Secondary | ICD-10-CM | POA: Diagnosis not present

## 2019-03-19 ENCOUNTER — Other Ambulatory Visit: Payer: Self-pay | Admitting: Family Medicine

## 2019-03-19 DIAGNOSIS — R899 Unspecified abnormal finding in specimens from other organs, systems and tissues: Secondary | ICD-10-CM

## 2019-03-19 DIAGNOSIS — E1142 Type 2 diabetes mellitus with diabetic polyneuropathy: Secondary | ICD-10-CM

## 2019-03-19 DIAGNOSIS — G629 Polyneuropathy, unspecified: Secondary | ICD-10-CM

## 2019-03-19 LAB — COMPREHENSIVE METABOLIC PANEL
AG Ratio: 1.6 (calc) (ref 1.0–2.5)
ALT: 19 U/L (ref 6–29)
AST: 18 U/L (ref 10–35)
Albumin: 4.2 g/dL (ref 3.6–5.1)
Alkaline phosphatase (APISO): 76 U/L (ref 37–153)
BUN: 20 mg/dL (ref 7–25)
CO2: 29 mmol/L (ref 20–32)
Calcium: 10.7 mg/dL — ABNORMAL HIGH (ref 8.6–10.4)
Chloride: 101 mmol/L (ref 98–110)
Creat: 0.89 mg/dL (ref 0.50–0.99)
Globulin: 2.6 g/dL (calc) (ref 1.9–3.7)
Glucose, Bld: 179 mg/dL — ABNORMAL HIGH (ref 65–99)
Potassium: 3.6 mmol/L (ref 3.5–5.3)
Sodium: 140 mmol/L (ref 135–146)
Total Bilirubin: 0.3 mg/dL (ref 0.2–1.2)
Total Protein: 6.8 g/dL (ref 6.1–8.1)

## 2019-03-19 LAB — CBC WITH DIFFERENTIAL/PLATELET
Absolute Monocytes: 1188 cells/uL — ABNORMAL HIGH (ref 200–950)
Basophils Absolute: 75 cells/uL (ref 0–200)
Basophils Relative: 0.6 %
Eosinophils Absolute: 175 cells/uL (ref 15–500)
Eosinophils Relative: 1.4 %
HCT: 38.2 % (ref 35.0–45.0)
Hemoglobin: 12.7 g/dL (ref 11.7–15.5)
Lymphs Abs: 3513 cells/uL (ref 850–3900)
MCH: 27.1 pg (ref 27.0–33.0)
MCHC: 33.2 g/dL (ref 32.0–36.0)
MCV: 81.4 fL (ref 80.0–100.0)
MPV: 9.8 fL (ref 7.5–12.5)
Monocytes Relative: 9.5 %
Neutro Abs: 7550 cells/uL (ref 1500–7800)
Neutrophils Relative %: 60.4 %
Platelets: 477 10*3/uL — ABNORMAL HIGH (ref 140–400)
RBC: 4.69 10*6/uL (ref 3.80–5.10)
RDW: 15 % (ref 11.0–15.0)
Total Lymphocyte: 28.1 %
WBC: 12.5 10*3/uL — ABNORMAL HIGH (ref 3.8–10.8)

## 2019-03-19 LAB — LIPID PANEL
Cholesterol: 116 mg/dL (ref <200)
HDL: 32 mg/dL — ABNORMAL LOW (ref >=50)
LDL Cholesterol (Calc): 60 mg/dL (calc)
Non-HDL Cholesterol (Calc): 84 mg/dL (calc) (ref <130)
Total CHOL/HDL Ratio: 3.6 (calc) (ref <5.0)
Triglycerides: 164 mg/dL — ABNORMAL HIGH (ref <150)

## 2019-03-19 LAB — THYROID PANEL WITH TSH
Free Thyroxine Index: 2.8 (ref 1.4–3.8)
T3 Uptake: 32 % (ref 22–35)
T4, Total: 8.7 ug/dL (ref 5.1–11.9)
TSH: 0.93 mIU/L (ref 0.40–4.50)

## 2019-03-22 ENCOUNTER — Other Ambulatory Visit: Payer: Self-pay

## 2019-03-22 ENCOUNTER — Other Ambulatory Visit: Payer: Self-pay | Admitting: Family Medicine

## 2019-03-22 ENCOUNTER — Other Ambulatory Visit: Payer: Medicare Other

## 2019-03-22 DIAGNOSIS — R899 Unspecified abnormal finding in specimens from other organs, systems and tissues: Secondary | ICD-10-CM

## 2019-03-22 DIAGNOSIS — E1142 Type 2 diabetes mellitus with diabetic polyneuropathy: Secondary | ICD-10-CM | POA: Diagnosis not present

## 2019-03-23 DIAGNOSIS — R899 Unspecified abnormal finding in specimens from other organs, systems and tissues: Secondary | ICD-10-CM | POA: Diagnosis not present

## 2019-03-24 ENCOUNTER — Telehealth: Payer: Self-pay | Admitting: Family Medicine

## 2019-03-24 NOTE — Telephone Encounter (Signed)
Entered in error

## 2019-03-25 ENCOUNTER — Telehealth: Payer: Self-pay | Admitting: Family Medicine

## 2019-03-25 NOTE — Telephone Encounter (Signed)
Notified may take 7 business days for results as lab in [place] does this test.

## 2019-03-26 LAB — CBC WITH DIFFERENTIAL/PLATELET
Absolute Monocytes: 851 cells/uL (ref 200–950)
Basophils Absolute: 46 cells/uL (ref 0–200)
Basophils Relative: 0.4 %
Eosinophils Absolute: 196 cells/uL (ref 15–500)
Eosinophils Relative: 1.7 %
HCT: 38.7 % (ref 35.0–45.0)
Hemoglobin: 12.5 g/dL (ref 11.7–15.5)
Lymphs Abs: 2875 cells/uL (ref 850–3900)
MCH: 25.8 pg — ABNORMAL LOW (ref 27.0–33.0)
MCHC: 32.3 g/dL (ref 32.0–36.0)
MCV: 79.8 fL — ABNORMAL LOW (ref 80.0–100.0)
MPV: 9.4 fL (ref 7.5–12.5)
Monocytes Relative: 7.4 %
Neutro Abs: 7533 cells/uL (ref 1500–7800)
Neutrophils Relative %: 65.5 %
Platelets: 472 10*3/uL — ABNORMAL HIGH (ref 140–400)
RBC: 4.85 10*6/uL (ref 3.80–5.10)
RDW: 15.1 % — ABNORMAL HIGH (ref 11.0–15.0)
Total Lymphocyte: 25 %
WBC: 11.5 10*3/uL — ABNORMAL HIGH (ref 3.8–10.8)

## 2019-03-26 LAB — COMPLETE METABOLIC PANEL WITH GFR
AG Ratio: 1.8 (calc) (ref 1.0–2.5)
ALT: 20 U/L (ref 6–29)
AST: 22 U/L (ref 10–35)
Albumin: 4.4 g/dL (ref 3.6–5.1)
Alkaline phosphatase (APISO): 73 U/L (ref 37–153)
BUN: 23 mg/dL (ref 7–25)
CO2: 27 mmol/L (ref 20–32)
Calcium: 10.6 mg/dL — ABNORMAL HIGH (ref 8.6–10.4)
Chloride: 99 mmol/L (ref 98–110)
Creat: 0.94 mg/dL (ref 0.50–0.99)
GFR, Est African American: 73 mL/min/{1.73_m2} (ref >=60)
GFR, Est Non African American: 63 mL/min/{1.73_m2} (ref >=60)
Globulin: 2.5 g/dL (calc) (ref 1.9–3.7)
Glucose, Bld: 151 mg/dL — ABNORMAL HIGH (ref 65–139)
Potassium: 3.5 mmol/L (ref 3.5–5.3)
Sodium: 139 mmol/L (ref 135–146)
Total Bilirubin: 0.3 mg/dL (ref 0.2–1.2)
Total Protein: 6.9 g/dL (ref 6.1–8.1)

## 2019-03-26 LAB — HEMOGLOBIN A1C
Hgb A1c MFr Bld: 8.2 % of total Hgb — ABNORMAL HIGH (ref <5.7)
Mean Plasma Glucose: 189 (calc)
eAG (mmol/L): 10.4 (calc)

## 2019-03-26 LAB — VITAMIN D 1,25 DIHYDROXY
Vitamin D 1, 25 (OH)2 Total: 36 pg/mL (ref 18–72)
Vitamin D2 1, 25 (OH)2: <8 pg/mL
Vitamin D3 1, 25 (OH)2: 36 pg/mL

## 2019-03-26 LAB — PARATHYROID HORMONE, INTACT (NO CA): PTH: 20 pg/mL (ref 14–64)

## 2019-03-27 LAB — PTH-RELATED PEPTIDE: PTH-Related Protein (PTH-RP): 10 pg/mL — ABNORMAL LOW (ref 14–27)

## 2019-03-29 ENCOUNTER — Other Ambulatory Visit: Payer: Self-pay | Admitting: Family Medicine

## 2019-03-29 NOTE — Progress Notes (Signed)
Discussed lab results with patient, referral to be placed with Hematology for further evaluation and work up.  Patient in agreement with plan.

## 2019-03-29 NOTE — Progress Notes (Signed)
Telephone call to Alison Mays, discussed her results and repeat labs.  Discussed sending referral to hematology as they are able to better evaluate/add additional tests as needed for her hypercalcemia and CBC abnormalities.  Ms. Primmer verbalized understanding and denied any additional questions/concerns/needs.

## 2019-04-05 ENCOUNTER — Other Ambulatory Visit: Payer: Self-pay

## 2019-04-05 DIAGNOSIS — E1142 Type 2 diabetes mellitus with diabetic polyneuropathy: Secondary | ICD-10-CM

## 2019-04-05 MED ORDER — ONETOUCH VERIO VI STRP: ORAL_STRIP | 3 refills | Status: AC

## 2019-04-14 ENCOUNTER — Telehealth: Payer: Self-pay

## 2019-04-14 ENCOUNTER — Inpatient Hospital Stay: Payer: Medicare Other | Admitting: Hematology and Oncology

## 2019-04-14 NOTE — Telephone Encounter (Signed)
The pt was notified and appt scheduled. 

## 2019-04-14 NOTE — Telephone Encounter (Signed)
Can we see her back in clinic tomorrow/Friday?  We can do some blood work and an exam to see if there is anything else going on that is elevating her glucose levels.

## 2019-04-14 NOTE — Telephone Encounter (Signed)
The pt complaining of elevated blood fasting blood sugars ranging in the 220's. She is currently taking the Metformin and ozempic as recommended, but concern it's not getting her blood sugar under control.

## 2019-04-15 ENCOUNTER — Ambulatory Visit (INDEPENDENT_AMBULATORY_CARE_PROVIDER_SITE_OTHER): Payer: Medicare Other | Admitting: Family Medicine

## 2019-04-15 ENCOUNTER — Encounter: Payer: Self-pay | Admitting: Family Medicine

## 2019-04-15 ENCOUNTER — Other Ambulatory Visit: Payer: Self-pay

## 2019-04-15 VITALS — BP 129/79 | HR 80 | Temp 97.1°F | Ht 61.0 in | Wt 181.2 lb

## 2019-04-15 DIAGNOSIS — E1142 Type 2 diabetes mellitus with diabetic polyneuropathy: Secondary | ICD-10-CM

## 2019-04-15 DIAGNOSIS — R319 Hematuria, unspecified: Secondary | ICD-10-CM

## 2019-04-15 LAB — POCT URINALYSIS DIPSTICK
Bilirubin, UA: NEGATIVE
Glucose, UA: NEGATIVE
Ketones, UA: NEGATIVE
Leukocytes, UA: NEGATIVE
Nitrite, UA: NEGATIVE
Protein, UA: NEGATIVE
Spec Grav, UA: 1.015 (ref 1.010–1.025)
Urobilinogen, UA: 0.2 E.U./dL
pH, UA: 5 (ref 5.0–8.0)

## 2019-04-15 LAB — GLUCOSE, POCT (MANUAL RESULT ENTRY): POC Glucose: 133 mg/dl — AB (ref 70–99)

## 2019-04-15 MED ORDER — BLOOD GLUCOSE MONITOR KIT: PACK | 0 refills | Status: AC

## 2019-04-15 NOTE — Progress Notes (Incomplete)
?  [facility]  ?[address], [address] ?[place],  [address] ?Phone: [phone] ? Fax: [phone] ? ? ?Clinic Day:  04/15/2019 ? ?Referring physician: Verl Bangs, FNP ? ?Chief Complaint: Alison Mays is a 69 y.o. female with a history of hypercalcemia who is referred in consultation with Alison Skeeters, NP for assessment and management.  ? ?HPI: The patient was diagnosed with/has a history of hypercalcemia.  ? ?CBC: ?03/18/2019: Hematocrit 38.2, hemoglobin 12.7, MCV 81.4, platelets 477,000, WBC 12,500, ANC 7,550. Calcium 10.7 ?03/22/2019: Hematocrit 38.7, hemoglobin 12.5, MCV 79.8, platelets 472,000, WBC 11,500, ANC 7,533.  ? ?Followed Calcium: ?05/22/2017: 9.8 ?07/08/2017: 9.7 ?12/03/2017: 9.8 ?03/02/2018: 10.8 ?03/18/2019: 10.7 ?  ?Symptomatically, *** ? ?Past Medical History:  ?Diagnosis Date  ?? Diabetes mellitus without complication ([place])   ?? Hyperlipidemia   ?? Hypertension   ? ? ?Past Surgical History:  ?Procedure Laterality Date  ?? ABDOMINAL HYSTERECTOMY    ?? COLONOSCOPY WITH PROPOFOL N/A 01/05/2018  ? Procedure: COLONOSCOPY WITH PROPOFOL;  Surgeon: Jonathon Bellows, MD;  Location: [facility] ENDOSCOPY;  Service: Gastroenterology;  Laterality: N/A;  ? ? ?Family History  ?Problem Relation Age of Onset  ?? Healthy Mother   ?? Diabetes Father   ?? Heart attack Sister   ?? Diabetes Brother   ?? Diabetes Sister   ?? Stomach cancer Sister   ?? Healthy Son   ?? Healthy Daughter   ? ? ?Social History:  reports that she has been smoking cigarettes. She has a 10.00 pack-year smoking history. She has never used smokeless tobacco. She reports that she does not drink alcohol or use drugs.The patient is {Blank single:19197::"alone","accompanied by"} *** today. ? ?Allergies: No Known Allergies ? ?Current Medications: ?Current Outpatient Medications  ?Medication Sig Dispense Refill  ?? amLODipine (NORVASC) 5 MG tablet TAKE 1 TABLET(5 MG) BY MOUTH DAILY 90 tablet 1  ? ? blood glucose meter kit and supplies KIT Dispense based on patient and in

## 2019-04-15 NOTE — Assessment & Plan Note (Signed)
Unknown if glucometer at home is working well or not as of today.  Patient had a reading this morning of 240-269 and in clinic reading a short while after was 133.  Discussed how she is collecting the blood glucose for sample and requested she wash her hands with soap and water, dry well and then use lancet instead of utilizing an alcohol wipe, as unsure if that is contributing to the false readings.  No acute concerns today and remains asymptomatic.  Plan: 1) Obtain new glucometer today (rx given) 2) Utilize washing hands well with soap and water and drying instead of using an alcohol wipe 3) Keep blood glucose log 4) If glucose levels are still higher, to begin keeping a food journal to see if glucose levels are elevated after certain foods are eaten 5) We will see you back at your regularly scheduled appointment in 2 months, sooner, should the need arise

## 2019-04-15 NOTE — Progress Notes (Signed)
Subjective:    Patient ID: Alison Mays, female    DOB: 11-Nov-1950, 69 y.o.   MRN: [id number]  Alison Mays is a 69 y.o. female presenting on 04/15/2019 for Diabetes (elevated bs reading 240- 269s )   HPI  Pt presents today for follow up Type 2 Diabetes Mellitus.  He/she (caps): She is checking AM CBG at home with range of 240-269. -Current diabetic medications include: glipizide 31m daily, metformin XR 750 daily, ozempic 0.228mweekly -ACTION; IS/IS NOT: is not currently symptomatic -Actions; denies/reports/admits to: denies polydipsia, polyphagia, polyuria, headaches, diaphoresis, shakiness, chills, pain, numbness or tingling in extremities or changes in vision -Reports no structured exercise routine -Diet is moderate in salt, moderate in fat, and moderate in carbohydrates   [facility] 02/09/2019 07/07/2018 03/26/2018  Decreased Interest 0 0 0  Down, Depressed, Hopeless 0 0 0  PHQ - 2 Score 0 0 0  Altered sleeping - - -  Tired, decreased energy - - -  Change in appetite - - -  Feeling bad or failure about yourself  - - -  Trouble concentrating - - -  Moving slowly or fidgety/restless - - -  Suicidal thoughts - - -  PHQ-9 Score - - -  Difficult doing work/chores - - -    Social History   Tobacco Use  . Smoking status: Current Every Day Smoker    Packs/day: 0.25    Years: 40.00    Pack years: 10.00    Types: Cigarettes  . Smokeless tobacco: Never Used  . Tobacco comment: intermittent smoker over past 40 years, quit at time up to 6 months  Substance Use Topics  . Alcohol use: Never  . Drug use: Never    Review of Systems  Constitutional: Negative.   HENT: Negative.   Eyes: Negative.   Respiratory: Negative.   Cardiovascular: Negative.   Gastrointestinal: Negative.   Endocrine: Negative.   Genitourinary: Negative.   Musculoskeletal: Negative.   Skin: Negative.   Allergic/Immunologic: Negative.   Neurological: Negative.   Hematological:  Negative.   Psychiatric/Behavioral: Negative.    Per HPI unless specifically indicated above     Objective:    BP 129/79 (BP Location: Right Arm, Patient Position: Sitting, Cuff Size: Normal)   Pulse 80   Temp (!) 97.1 F (36.2 C) (Temporal)   Ht 5' 1"  (1.549 m)   Wt 181 lb 3.2 oz (82.2 kg)   BMI 34.24 kg/m   Wt Readings from Last 3 Encounters:  04/15/19 181 lb 3.2 oz (82.2 kg)  03/15/19 186 lb 12.8 oz (84.7 kg)  02/09/19 189 lb (85.7 kg)    Physical Exam Vitals reviewed.  Constitutional:      General: She is not in acute distress.    Appearance: Normal appearance. She is obese. She is not ill-appearing or toxic-appearing.  HENT:     Head: Normocephalic.  Eyes:     General: Lids are normal. Vision grossly intact.        Right eye: No discharge.        Left eye: No discharge.     Extraocular Movements: Extraocular movements intact.     Conjunctiva/sclera: Conjunctivae normal.     Pupils: Pupils are equal, round, and reactive to light.  Cardiovascular:     Rate and Rhythm: Normal rate and regular rhythm.     Pulses: Normal pulses.          Dorsalis pedis pulses are 2+ on the right side  and 2+ on the left side.       Posterior tibial pulses are 2+ on the right side and 2+ on the left side.     Heart sounds: Normal heart sounds. No murmur. No friction rub. No gallop.   Pulmonary:     Effort: Pulmonary effort is normal. No respiratory distress.     Breath sounds: Normal breath sounds.  Musculoskeletal:     Right lower leg: No edema.     Left lower leg: No edema.  Feet:     Right foot:     Skin integrity: Skin integrity normal.     Left foot:     Skin integrity: Skin integrity normal.  Skin:    General: Skin is warm and dry.     Capillary Refill: Capillary refill takes less than 2 seconds.  Neurological:     General: No focal deficit present.     Mental Status: She is alert and oriented to person, place, and time.     Cranial Nerves: No cranial nerve deficit.      Sensory: No sensory deficit.     Motor: No weakness.     Coordination: Coordination normal.     Gait: Gait normal.  Psychiatric:        Attention and Perception: Attention and perception normal.        Mood and Affect: Mood and affect normal.        Speech: Speech normal.        Behavior: Behavior normal. Behavior is cooperative.        Thought Content: Thought content normal.        Cognition and Memory: Cognition and memory normal.        Judgment: Judgment normal.     Results for orders placed or performed in visit on 04/15/19  POCT Glucose (CBG)  Result Value Ref Range   POC Glucose 133 (A) 70 - 99 mg/dl  POCT Urinalysis Dipstick  Result Value Ref Range   Color, UA yellow    Clarity, UA clear    Glucose, UA Negative Negative   Bilirubin, UA negative    Ketones, UA negative    Spec Grav, UA 1.015 1.010 - 1.025   Blood, UA trace    pH, UA 5.0 5.0 - 8.0   Protein, UA Negative Negative   Urobilinogen, UA 0.2 0.2 or 1.0 E.U./dL   Nitrite, UA negative    Leukocytes, UA Negative Negative   Appearance     Odor        Assessment & Plan:   Problem List Items Addressed This Visit      Endocrine   Type 2 diabetes mellitus with peripheral neuropathy (HCC) - Primary    Unknown if glucometer at home is working well or not as of today.  Patient had a reading this morning of 240-269 and in clinic reading a short while after was 133.  Discussed how she is collecting the blood glucose for sample and requested she wash her hands with soap and water, dry well and then use lancet instead of utilizing an alcohol wipe, as unsure if that is contributing to the false readings.  No acute concerns today and remains asymptomatic.  Plan: 1) Obtain new glucometer today (rx given) 2) Utilize washing hands well with soap and water and drying instead of using an alcohol wipe 3) Keep blood glucose log 4) If glucose levels are still higher, to begin keeping a food journal to see if glucose levels  are elevated after certain foods are eaten 5) We will see you back at your regularly scheduled appointment in 2 months, sooner, should the need arise      Relevant Medications   blood glucose meter kit and supplies KIT   Other Relevant Orders   POCT Glucose (CBG) (Completed)   POCT Urinalysis Dipstick (Completed)    Other Visit Diagnoses    Hematuria, unspecified type       Relevant Orders   Urinalysis, microscopic only      Meds ordered this encounter  Medications  . blood glucose meter kit and supplies KIT    Sig: Dispense based on patient and insurance preference. Use up to four times daily as directed. (FOR ICD-9 250.00, 250.01).    Dispense:  1 each    Refill:  0    Please allow patient to choose from what is covered under insurance.    Order Specific Question:   Number of strips    Answer:   100    Order Specific Question:   Number of lancets    Answer:   100      Follow up plan: Return in about 3 months (around 07/16/2019) for A1C DM F/U.   Harlin Rain, [place] Family Nurse Practitioner [facility] 04/15/2019, 10:16 AM

## 2019-04-15 NOTE — Patient Instructions (Signed)
As we discussed, wash your hands with soap and water, dry well before performing your finger stick for your glucose.  It is possible that the alcohol is falsely elevating your glucose reading at home.  I have sent in a prescription for a new glucometer if that machine is defective as well.  Keep your appointment with Hematology tomorrow.  We will see you back for your regularly scheduled diabetes visit in the next 2 months.  You will receive a survey after today's visit either digitally by e-mail or paper by [employer]. Your experiences and feedback matter to [place].  Please respond so we know how we are doing as we provide care for you.  Call us with any questions/concerns/needs.  It is my goal to be available to you for your health concerns.  Thanks for choosing me to be a partner in your healthcare needs!  Harlin Rain, FNP-C Family Nurse Practitioner [facility] Phone: [phone]

## 2019-04-16 ENCOUNTER — Inpatient Hospital Stay: Payer: Medicare Other | Attending: Hematology and Oncology | Admitting: Hematology and Oncology

## 2019-04-16 ENCOUNTER — Encounter: Payer: Self-pay | Admitting: Hematology and Oncology

## 2019-04-16 ENCOUNTER — Other Ambulatory Visit: Payer: Self-pay | Admitting: Family Medicine

## 2019-04-16 ENCOUNTER — Inpatient Hospital Stay: Payer: Medicare Other

## 2019-04-16 VITALS — BP 119/69 | HR 87 | Temp 98.2°F | Resp 18 | Ht 61.0 in | Wt 180.9 lb

## 2019-04-16 DIAGNOSIS — R718 Other abnormality of red blood cells: Secondary | ICD-10-CM | POA: Insufficient documentation

## 2019-04-16 DIAGNOSIS — D473 Essential (hemorrhagic) thrombocythemia: Secondary | ICD-10-CM

## 2019-04-16 DIAGNOSIS — R7989 Other specified abnormal findings of blood chemistry: Secondary | ICD-10-CM | POA: Insufficient documentation

## 2019-04-16 DIAGNOSIS — E1142 Type 2 diabetes mellitus with diabetic polyneuropathy: Secondary | ICD-10-CM

## 2019-04-16 DIAGNOSIS — D75839 Thrombocytosis, unspecified: Secondary | ICD-10-CM

## 2019-04-16 LAB — CBC WITH DIFFERENTIAL/PLATELET
Abs Immature Granulocytes: 0.03 10*3/uL (ref 0.00–0.07)
Basophils Absolute: 0.1 10*3/uL (ref 0.0–0.1)
Basophils Relative: 1 %
Eosinophils Absolute: 0.1 10*3/uL (ref 0.0–0.5)
Eosinophils Relative: 1 %
HCT: 38.8 % (ref 36.0–46.0)
Hemoglobin: 12.4 g/dL (ref 12.0–15.0)
Immature Granulocytes: 0 %
Lymphocytes Relative: 26 %
Lymphs Abs: 3.1 10*3/uL (ref 0.7–4.0)
MCH: 25.6 pg — ABNORMAL LOW (ref 26.0–34.0)
MCHC: 32 g/dL (ref 30.0–36.0)
MCV: 80 fL (ref 80.0–100.0)
Monocytes Absolute: 1 10*3/uL (ref 0.1–1.0)
Monocytes Relative: 8 %
Neutro Abs: 7.5 10*3/uL (ref 1.7–7.7)
Neutrophils Relative %: 64 %
Platelets: 471 10*3/uL — ABNORMAL HIGH (ref 150–400)
RBC: 4.85 MIL/uL (ref 3.87–5.11)
RDW: 16.3 % — ABNORMAL HIGH (ref 11.5–15.5)
WBC: 11.8 10*3/uL — ABNORMAL HIGH (ref 4.0–10.5)
nRBC: 0 % (ref 0.0–0.2)

## 2019-04-16 LAB — FERRITIN: Ferritin: 34 ng/mL (ref 11–307)

## 2019-04-16 LAB — IRON AND TIBC
Iron: 39 ug/dL (ref 28–170)
Saturation Ratios: 12 % (ref 10.4–31.8)
TIBC: 336 ug/dL (ref 250–450)
UIBC: 297 ug/dL

## 2019-04-16 LAB — URINALYSIS, MICROSCOPIC ONLY
Bacteria, UA: NONE SEEN /HPF
Hyaline Cast: NONE SEEN /LPF

## 2019-04-16 LAB — SEDIMENTATION RATE: Sed Rate: 30 mm/hr (ref 0–30)

## 2019-04-16 NOTE — Progress Notes (Signed)
[facility]  [address], [address] [place], [place] [address] Phone: [phone]  Fax: [phone]   Clinic Day:  04/16/2019  Referring physician: Verl Bangs, FNP  Chief Complaint: Alison Mays is a 69 y.o. female with hypercalcemia and an abnormal CBC who is referred in consultation by Cyndia Skeeters, NP for assessment and management.   HPI: The patient has a history of hypercalcemia. She is surprised by her abnormal labs as she feels good.  She denies any nausea, vomiting, abdominal pain, muscle weakness, bone pain, decreased concentration or confusion.  She denies a history of nephrolithiasis.  CBC: 07/08/2017: Hematocrit 35.8, hemoglobin 12.1, MCV 78.0, platelets 448,000, WBC 12,600. 03/18/2019: Hematocrit 38.2, hemoglobin 12.7, MCV 81.4, platelets 477,000, WBC 12,500, ANC 7,550. 03/22/2019: Hematocrit 38.7, hemoglobin 12.5, MCV 79.8, platelets 472,000, WBC 11,500, ANC 7,533.   Calcium: 07/08/2017: 9.7 12/03/2017: 9.8 03/02/2018: 10.8   Albumen 4.7. 03/18/2019: 10.7.  Albumen 4.4. 03/22/2019: 10.6.   Symptomatically, she denies any fevers, sweats or weight loss.  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 She says that she has lately been eating vegetables and has not been wanting to eat meat. She has been craving sweets. She denies any ice pica.  She denies any restless legs. She has been taking Elderberry and a hair and skin vitamin which has biotin and flaxseed (5000 mg). She does not take any calcium suppliments. She is not on any iron supplements.   She has had no infections in the past year.  She notes a gum infection 09/2018. She then had three teeth pulled and replaced.   She denies any melena, hematochezia, hematuria or vaginal bleeding.  Colonoscopy on 01/05/2018 by Dr. Vicente Males revealed a 5 mm polyp in the descending colon.  No tissue was identified for pathology.    Her sister had gastric cancer at age 3.   Past Medical  History:  Diagnosis Date  . Diabetes mellitus without complication ([place])   . Hyperlipidemia   . Hypertension     Past Surgical History:  Procedure Laterality Date  . ABDOMINAL HYSTERECTOMY    . COLONOSCOPY WITH PROPOFOL N/A 01/05/2018   Procedure: COLONOSCOPY WITH PROPOFOL;  Surgeon: Jonathon Bellows, MD;  [facility]: [facility] END[facility];  Service: Gastroenterology;  Laterality: N/A;    Family History  Problem Relation Age of Onset  . Healthy Mother   . Diabetes Father   . Heart attack Sister   . Diabetes Brother   . Diabetes Sister   . Stomach cancer Sister   . Healthy Son   . Healthy Daughter     Social History:  reports that she has been smoking cigarettes. She has a 10.00 pack-year smoking history. She has been smoking for 11 years. She has never used smokeless tobacco. She reports that she does not drink alcohol or use drugs. She is retired; she used to work in a factory. The patient is alone  today.  Allergies: No Known Allergies  Current Medications: Current Outpatient Medications  Medication Sig Dispense Refill  . amLODipine (NORVASC) 5 MG tablet TAKE 1 TABLET(5 MG) BY MOUTH DAILY 90 tablet 1  . blood glucose meter kit and supplies KIT Dispense based on patient and insurance preference. Use up to four times daily as directed. (FOR ICD-9 250.00, 250.01). 1 each 0  . cyclobenzaprine (FLEXERIL) 10 MG tablet TAKE 1 TABLET(10 MG) BY MOUTH THREE TIMES DAILY AS NEEDED FOR MUSCLE SPASMS 60 tablet 2  . gabapentin (NEURONTIN) 300 MG capsule  TAKE 1 CAPSULE(300 MG) BY MOUTH THREE TIMES DAILY 90 capsule 1  . glipiZIDE (GLUCOTROL) 10 MG tablet TAKE 1 TABLET(10 MG) BY MOUTH TWICE DAILY BEFORE A MEAL (Patient taking differently: Take 10 mg by mouth daily. ) 180 tablet 0  . Insulin Pen Needle (BD PEN NEEDLE NANO 2ND GEN) 32G X 4 MM MISC To apply to Ozempic pen needle weekly. 30 each 1  . losartan (COZAAR) 100 MG tablet TAKE 1 TABLET(100 MG) BY MOUTH DAILY 90 tablet 0  . metFORMIN (GLUCOPHAGE-XR)  750 MG 24 hr tablet TAKE 1 TABLET BY MOUTH TWICE DAILY 180 tablet 0  . metoprolol tartrate (LOPRESSOR) 50 MG tablet TAKE 1 TABLET(50 MG) BY MOUTH TWICE DAILY 180 tablet 0  . ONETOUCH DELICA LANCETS 20U MISC 1 Device by Does not apply route daily. 100 each 4  . ONETOUCH VERIO test strip USE 1 STRIP TO TEST TWICE DAILY AS DIRECTED 100 strip 3  . OZEMPIC, 0.25 OR 0.5 MG/DOSE, 2 MG/1.5ML SOPN Inject 0.25 mg into the skin once a week. For first 4 weeks. Then increase dose to 0.69m weekly 1 pen 2  . rosuvastatin (CRESTOR) 40 MG tablet TAKE 1 TABLET(40 MG) BY MOUTH DAILY 90 tablet 0  . chlorthalidone (HYGROTON) 25 MG tablet TAKE 1 TABLET(25 MG) BY MOUTH DAILY (Patient not taking: Reported on 04/15/2019) 90 tablet 0  . VENTOLIN HFA 108 (90 Base) MCG/ACT inhaler INHALE 1 TO 2 PUFFS BY MOUTH INTO THE LUNGS EVERY 6 HOURS AS NEEDED FOR SHORTNESS OF BREATH (Patient not taking: Reported on 03/15/2019) 18 g 5   No current facility-administered medications for this visit.    Review of Systems  Constitutional: Negative.  Negative for chills, diaphoresis, fever, malaise/fatigue and weight loss.  HENT: Negative.  Negative for congestion, ear pain, nosebleeds, sinus pain and sore throat.   Eyes: Negative.  Negative for blurred vision and double vision.  Respiratory: Negative.  Negative for cough, sputum production and shortness of breath.   Cardiovascular: Negative.  Negative for chest pain, palpitations and leg swelling.  Gastrointestinal: Positive for constipation (taking laxatives). Negative for abdominal pain, blood in stool, diarrhea, melena, nausea and vomiting.  Genitourinary: Negative.  Negative for dysuria, frequency, hematuria and urgency.  Musculoskeletal: Negative.  Negative for back pain, falls, joint pain and myalgias.  Skin: Negative.  Negative for rash.  Neurological: Positive for sensory change (neuropathy in left foot). Negative for dizziness, speech change, focal weakness, weakness and headaches.    Endo/Heme/Allergies: Negative.  Does not bruise/bleed easily.  Psychiatric/Behavioral: Negative.  Negative for depression and memory loss. The patient is not nervous/anxious and does not have insomnia.   All other systems reviewed and are negative.  Performance status (ECOG): 0  Vitals Blood pressure 119/69, pulse 87, temperature 98.2 F (36.8 C), temperature source Tympanic, resp. rate 18, height _0  (1.549 m), weight 180 lb 14.2 oz (82.1 kg), SpO2 100 %.   Physical Exam  Constitutional: She is oriented to person, place, and time. She appears well-developed and well-nourished. No distress.  HENT:  Head: Normocephalic and atraumatic.  Mouth/Throat: Oropharynx is clear and moist and mucous membranes are normal. No oral lesions.  Dark hair.  Torus.  Mask.  Eyes: Pupils are equal, round, and reactive to light. Conjunctivae and EOM are normal.  Glasses.  BOwens Shark  Cardiovascular: Normal rate and regular rhythm. Exam reveals no gallop and no friction rub.  No murmur heard. Pulmonary/Chest: Effort normal and breath sounds normal. She has no wheezes. She has no  rhonchi. She has no rales.  Abdominal: Soft. Normal appearance and bowel sounds are normal. She exhibits no mass. There is no hepatosplenomegaly. There is no abdominal tenderness. There is no rebound and no guarding.  Musculoskeletal:        General: Tenderness (lower back) present. Normal range of motion.     Cervical back: Normal range of motion and neck supple.  Lymphadenopathy:       Head (right side): No preauricular, no posterior auricular and no occipital adenopathy present.       Head (left side): No preauricular and no posterior auricular adenopathy present.    She has no cervical adenopathy.       Right cervical: No superficial cervical adenopathy present.   She has no axillary adenopathy.       Right: No inguinal and no supraclavicular adenopathy present.       Left: No inguinal and no supraclavicular adenopathy present.   Neurological: She is alert and oriented to person, place, and time.  Skin: Skin is warm, dry and intact. No bruising, no lesion and no rash noted. No erythema.  Psychiatric: She has a normal mood and affect. Her behavior is normal. Judgment and thought content normal.    Office Visit on 04/15/2019  Component Date Value Ref Range Status  . POC Glucose 04/15/2019 133* 70 - 99 mg/dl Final  . WBC, UA 04/15/2019 0-5  0 - 5 /HPF Final  . RBC / HPF 04/15/2019 0-2  0 - 2 /HPF Final  . Squamous Epithelial / LPF 04/15/2019 0-5  < OR = 5 /HPF Final  . Bacteria, UA 04/15/2019 NONE SEEN  NONE SEEN /HPF Final  . Hyaline Cast 04/15/2019 NONE SEEN  NONE SEEN /LPF Final  . Color, UA 04/15/2019 yellow   Final      . Clarity, UA 04/15/2019 clear   Final  . Glucose, UA 04/15/2019 Negative  Negative Final  . Bilirubin, UA 04/15/2019 negative   Final  . Ketones, UA 04/15/2019 negative   Final  . Spec Grav, UA 04/15/2019 1.015  1.010 - 1.025 Final  . Blood, UA 04/15/2019 trace   Final  . pH, UA 04/15/2019 5.0  5.0 - 8.0 Final  . Protein, UA 04/15/2019 Negative  Negative Final  . Urobilinogen, UA 04/15/2019 0.2  0.2 or 1.0 E.U./dL Final  . Nitrite, UA 04/15/2019 negative   Final  . Leukocytes, UA 04/15/2019 Negative  Negative Final    Assessment:  Alison Mays is a 69 y.o. female with microcytic RBC indices, thrombocytosis, and hypercalcemia.  She does not eat much meat.  She denies any melena, hematochezia, hematuria or vaginal bleeding.  Colonoscopy on 01/05/2018 revealed a 5 mm polyp in the descending colon (no path available).  Symptomatically, she feels good.  She denies any fevers, sweats or weight loss.  She denies any bone pain.  Exam is unremarkable.  Plan: 1.   Labs today:  CBC with diff , ferritin, iron studies, sed rate, CRP, ionized calcium, PTH, SPEP, FLCA. 2.   Microcytic RBC indices  Etiology likely secondary to iron deficiency (diet related).  Patient denies any bleeding.   Colonoscopy in 12/2017 revealed a tiny polyp.  Check iron studies.  Anticipate a trial of oral iron. 3.   Thrombocytosis  Discuss differential diagnosis (reactive vs myeloproliferative disorder).  Suspect reactive due to microcytic RBC indices (? early iron deficiency).  If reactive work-up negative, will pursue MPD evaluation. 4.   Hypercalcemia  Serum albumen is elevated  and thus patient may have a normal calcium level.  Check intact PTH to r/o primary hyperparathyroidism vs non-PTH mediated hypercalcemia.  Discuss additional testing if hypercalcemia documented with an elevated ionized calcium. 5.    RTC in 1 week for MD assessment, review of work-up and discussion regarding direction of therapy.  I discussed the assessment and treatment plan with the patient.  The patient was provided an opportunity to ask questions and all were answered.  The patient agreed with the plan and demonstrated an understanding of the instructions.  The patient was advised to call back if the symptoms worsen or if the condition fails to improve as anticipated.   Nissi Doffing C. Mike Gip, MD, PhD    04/16/2019, 10:06 AM  I, Heywood Footman, am acting as Education administrator for [employer]. Mike Gip, MD, PhD.  I, Mady Oubre C. Mike Gip, MD, have reviewed the above documentation for accuracy and completeness, and I agree with the above.

## 2019-04-17 LAB — PTH, INTACT AND CALCIUM: Calcium, Total (PTH): 10.1 mg/dL (ref 8.7–10.3)

## 2019-04-17 LAB — CALCIUM, IONIZED: Calcium, Ionized, Serum: 5.5 mg/dL (ref 4.5–5.6)

## 2019-04-18 ENCOUNTER — Other Ambulatory Visit: Payer: Self-pay | Admitting: Family Medicine

## 2019-04-18 DIAGNOSIS — E1142 Type 2 diabetes mellitus with diabetic polyneuropathy: Secondary | ICD-10-CM

## 2019-04-19 ENCOUNTER — Other Ambulatory Visit: Payer: Medicare Other

## 2019-04-19 LAB — KAPPA/LAMBDA LIGHT CHAINS
Kappa free light chain: 21.7 mg/L — ABNORMAL HIGH (ref 3.3–19.4)
Kappa, lambda light chain ratio: 1.21 (ref 0.26–1.65)
Lambda free light chains: 18 mg/L (ref 5.7–26.3)

## 2019-04-20 ENCOUNTER — Other Ambulatory Visit: Payer: Self-pay | Admitting: Hematology and Oncology

## 2019-04-20 ENCOUNTER — Inpatient Hospital Stay: Payer: Medicare Other

## 2019-04-20 ENCOUNTER — Other Ambulatory Visit: Payer: Medicare Other

## 2019-04-20 DIAGNOSIS — R7989 Other specified abnormal findings of blood chemistry: Secondary | ICD-10-CM | POA: Diagnosis not present

## 2019-04-20 DIAGNOSIS — R718 Other abnormality of red blood cells: Secondary | ICD-10-CM | POA: Diagnosis not present

## 2019-04-20 LAB — MULTIPLE MYELOMA PANEL, SERUM
Albumin SerPl Elph-Mcnc: 3.7 g/dL (ref 2.9–4.4)
Albumin/Glob SerPl: 1.3 (ref 0.7–1.7)
Alpha 1: 0.3 g/dL (ref 0.0–0.4)
Alpha2 Glob SerPl Elph-Mcnc: 1 g/dL (ref 0.4–1.0)
B-Globulin SerPl Elph-Mcnc: 1.1 g/dL (ref 0.7–1.3)
Gamma Glob SerPl Elph-Mcnc: 0.7 g/dL (ref 0.4–1.8)
Globulin, Total: 3 g/dL (ref 2.2–3.9)
IgA: 208 mg/dL (ref 87–352)
IgG (Immunoglobin G), Serum: 799 mg/dL (ref 586–1602)
IgM (Immunoglobulin M), Srm: 29 mg/dL (ref 26–217)
Total Protein ELP: 6.7 g/dL (ref 6.0–8.5)

## 2019-04-21 LAB — PTH, INTACT AND CALCIUM
Calcium, Total (PTH): 10.2 mg/dL (ref 8.7–10.3)
PTH: 20 pg/mL (ref 15–65)

## 2019-04-21 LAB — MISC LABCORP TEST (SEND OUT): Labcorp test code: 6627

## 2019-04-21 NOTE — Progress Notes (Signed)
[facility]  [address] [place], [place] [address] Phone: [phone]  Fax: [phone]   Telemedicine Office Visit:  04/23/2019  Referring physician: Verl Bangs, FNP  I connected with Alison Mays on 04/23/19 at 11:08 AM by videoconferencing and verified that I was speaking with the correct person using 2 identifiers.  The patient was at  home.  I discussed the limitations, risk, security and privacy concerns of performing an evaluation and management service by videoconferencing and the availability of in person appointments.  I also discussed with the patient that there may be a patient responsible charge related to this service.  The patient expressed understanding and agreed to proceed.   Chief Complaint: Alison Mays is a 69 y.o. female with hypercalcemia and an abnormal CBC who is seen for review of work-up and discussion regarding direction of therapy.   HPI: The patient was last seen in the hematology clinic on 04/16/2019 for initial consultation. At that time, she felt good.  She denied any B symptoms.  Exam was unremarkable.  Pseudo-hypercalcemia was suspected secondary to an elevated albumen.  Workup revealed a hematocrit 38.2, hemoglobin 12.4, MCV 80.0, platelets 471,000, WBC 11800, and ANC 7500. Ferritin was 34 with an iron saturation of 12% and TIBC 336. SPEP revealed no monoclonal protein.  Kappa free light chains 21.7, lambda free light chains 18.0 and ratio 1.21 (normal). Sed rate was 30 and CRP 3 (normal).  Ionized calcium was 5.5 (normal; 4.5 - 5.6). PTH was 20 with a calcium of 10.2 (normal).   During the interim, she feels pretty good and the same as she did last time. She agrees to adding more iron in her diet. She has never taken oral iron before. She notes that when she was in her 20's she used to receive iron shots due to low iron and heavy periods. She is willing to take oral iron 65 mg.    Past Medical History:    Diagnosis Date  . Diabetes mellitus without complication ([place])   . Hyperlipidemia   . Hypertension     Past Surgical History:  Procedure Laterality Date  . ABDOMINAL HYSTERECTOMY    . COLONOSCOPY WITH PROPOFOL N/A 01/05/2018   Procedure: COLONOSCOPY WITH PROPOFOL;  Surgeon: Jonathon Bellows, MD;  Location: [facility] ENDOSCOPY;  Service: Gastroenterology;  Laterality: N/A;    Family History  Problem Relation Age of Onset  . Healthy Mother   . Diabetes Father   . Heart attack Sister   . Diabetes Brother   . Diabetes Sister   . Stomach cancer Sister   . Healthy Son   . Healthy Daughter     Social History:  reports that she has been smoking cigarettes. She has a 10.00 pack-year smoking history. She has never used smokeless tobacco. She reports that she does not drink alcohol or use drugs. Her sister has stomach cancer at 89. Her brother is diabetic. Her father was diabetic. She is retired; used to work at [employer].  The patient is alone  today.  Participants in the patient's visit and their role in the encounter included the patient, Heywood Footman, and [employer], CMA, today. The intake visit was provided by Vito Berger, CMA.    Allergies: No Known Allergies  Current Medications: Current Outpatient Medications  Medication Sig Dispense Refill  . Accu-Chek Softclix Lancets lancets USE UP TO FOUR TIMES DAILY AS DIRECTED 300 each 1  . amLODipine (NORVASC) 5 MG tablet TAKE 1  TABLET(5 MG) BY MOUTH DAILY 90 tablet 1  . blood glucose meter kit and supplies KIT Dispense based on patient and insurance preference. Use up to four times daily as directed. (FOR ICD-9 250.00, 250.01). 1 each 0  . cyclobenzaprine (FLEXERIL) 10 MG tablet TAKE 1 TABLET(10 MG) BY MOUTH THREE TIMES DAILY AS NEEDED FOR MUSCLE SPASMS 60 tablet 2  . gabapentin (NEURONTIN) 300 MG capsule TAKE 1 CAPSULE(300 MG) BY MOUTH THREE TIMES DAILY 90 capsule 1  . glipiZIDE (GLUCOTROL) 10 MG tablet TAKE 1 TABLET(10 MG) BY MOUTH  TWICE DAILY BEFORE A MEAL 180 tablet 0  . Insulin Pen Needle (BD PEN NEEDLE NANO 2ND GEN) 32G X 4 MM MISC To apply to Ozempic pen needle weekly. 30 each 1  . losartan (COZAAR) 100 MG tablet TAKE 1 TABLET(100 MG) BY MOUTH DAILY 90 tablet 0  . metFORMIN (GLUCOPHAGE-XR) 750 MG 24 hr tablet TAKE 1 TABLET BY MOUTH TWICE DAILY 180 tablet 0  . metoprolol tartrate (LOPRESSOR) 50 MG tablet TAKE 1 TABLET(50 MG) BY MOUTH TWICE DAILY 180 tablet 0  . ONETOUCH VERIO test strip USE 1 STRIP TO TEST TWICE DAILY AS DIRECTED 100 strip 3  . OZEMPIC, 0.25 OR 0.5 MG/DOSE, 2 MG/1.5ML SOPN Inject 0.25 mg into the skin once a week. For first 4 weeks. Then increase dose to 0.30m weekly 1 pen 2  . rosuvastatin (CRESTOR) 40 MG tablet TAKE 1 TABLET(40 MG) BY MOUTH DAILY 90 tablet 0  . chlorthalidone (HYGROTON) 25 MG tablet TAKE 1 TABLET(25 MG) BY MOUTH DAILY (Patient not taking: Reported on 04/15/2019) 90 tablet 0  . VENTOLIN HFA 108 (90 Base) MCG/ACT inhaler INHALE 1 TO 2 PUFFS BY MOUTH INTO THE LUNGS EVERY 6 HOURS AS NEEDED FOR SHORTNESS OF BREATH (Patient not taking: Reported on 03/15/2019) 18 g 5   No current facility-administered medications for this visit.    Review of Systems  Constitutional: Positive for diaphoresis. Negative for fever, malaise/fatigue and weight loss.       Feels "pretty good".  HENT: Negative for congestion and sore throat.   Eyes: Negative for blurred vision and double vision.  Respiratory: Negative for cough and shortness of breath.   Cardiovascular: Negative for chest pain, palpitations and leg swelling.  Gastrointestinal: Positive for constipation (taking laxative). Negative for diarrhea, nausea and vomiting.  Genitourinary: Negative for frequency and urgency.  Musculoskeletal: Negative for back pain, falls and myalgias.  Skin: Negative for rash.  Neurological: Positive for sensory change (neuropathy in left foot). Negative for dizziness, weakness and headaches.  Psychiatric/Behavioral:  Negative for depression. The patient is not nervous/anxious.    Performance status (ECOG): 0 - Asymptomatic  Vitals There were no vitals taken for this visit.   Physical Exam Nursing note reviewed.  Constitutional:      General: She is not in acute distress.    Appearance: Normal appearance. She is well-developed and well-nourished.     Interventions: Face mask in place.  HENT:     Head: Normocephalic and atraumatic.     Mouth/Throat:     Mouth: Mucous membranes are normal. No oral lesions.      Comments: Short dark hair. Eyes:     Extraocular Movements: EOM normal.     Conjunctiva/sclera: Conjunctivae normal.     Comments: Glasses.  Neurological:     Mental Status: She is alert and oriented to person, place, and time.  Psychiatric:        Mood and Affect: Mood and affect normal.  Behavior: Behavior normal.        Thought Content: Thought content normal.        Judgment: Judgment normal.    No visits with results within 3 Day(s) from this visit.  Latest known visit with results is:  Appointment on 04/20/2019  Component Date Value Ref Range Status  . PTH 04/20/2019 20  15 - 65 pg/mL Final  . Calcium, Total (PTH) 04/20/2019 10.2  8.7 - 10.3 mg/dL Final  . PTH Interp 04/20/2019 Comment   Final   Comment: (NOTE) Interpretation                 Intact PTH    Calcium                                (pg/mL)      (mg/dL) Normal                          15 - 65     8.6 - 10.2 Primary Hyperparathyroidism         >65          >10.2 Secondary Hyperparathyroidism       >65          <10.2 Non-Parathyroid Hypercalcemia       <65          >10.2 Hypoparathyroidism                  <15          < 8.6 Non-Parathyroid Hypocalcemia    15 - 65          < 8.6 Performed At: [facility] [place], [place] [id number] Rush Farmer MD OM:[id number]     Assessment:  Alison Mays is a 69 y.o. female with with microcytic RBC indices and thrombocytosis.  She has  pseudohypercalcemia.  She does not eat much meat.  She denies any melena, hematochezia, hematuria or vaginal bleeding.  Workup on 04/16/2019 revealed a hematocrit 38.2, hemoglobin 12.4, MCV 80.0, platelets 471,000, WBC 1180, and ANC 7500. Ferritin was 34 with an iron saturation of 12% and TIBC 336. Normal studies included: SPEP, free light chain assay, sed rate, CRP. Ionized calcium was 5.5 (4.5 - 5.6). PTH was 20 with a calcium of 10.2 (normal).   Colonoscopy on 01/05/2018 revealed a 5 mm polyp in the descending colon (no path available).  Symptomatically,  she feels "good".  She denies any bleeding.  Plan: 1.   Microcytic RBC indices             Hematocrit 38.2.  Hemoglobin 12.4.  MCV 80.0.    Ferritin 34 with an iron saturation of 12%.    Ferritin goal 100.    Etiology felt secondary to iron deficiency (diet related).             Patient denies any bleeding.  Colonoscopy in 12/2017 revealed a tiny polyp.             Begin ferrous sulfate 325 mg p.o. daily when she is or vitamin C. 3.   Thrombocytosis             Platelet count 471,000.    Etiology felt secondary to iron deficiency.  If thrombocytosis does not improve with improvement in iron stores, will pursue a work-up  Continue to monitor. 4.   Pseudohypercalcemia             Ionized calcium 5.5 (normal).  Calcium elevated secondary to elevated albumen.  SPEP and FLCA were normal.  No further evaluation needed. 5.   RTC in 6 weeks for labs (CBC with diff, ferritin). 6.   RTC in 3 months for MD assessment, labs (CBC with diff, ferritin, iron studies).  I discussed the assessment and treatment plan with the patient.  The patient was provided an opportunity to ask questions and all were answered.  The patient agreed with the plan and demonstrated an understanding of the instructions.  The patient was advised to call back if the symptoms worsen or if the condition fails to improve as anticipated.  I provided 13 minutes  (11:08 AM - 11:21 AM) of non-face-to-face time during this this encounter and > 50% was spent counseling as documented under my assessment and plan.    Lequita Asal, MD, PhD    04/23/2019, 11:08 AM  I, Heywood Footman, am acting as Education administrator for [employer]. Mike Gip, MD, PhD.  I, Alveria Mcglaughlin C. Mike Gip, MD, have reviewed the above documentation for accuracy and completeness, and I agree with the above.

## 2019-04-22 ENCOUNTER — Encounter: Payer: Self-pay | Admitting: Hematology and Oncology

## 2019-04-22 NOTE — Progress Notes (Signed)
No new changes noted today. The patient Name and DOB has been verified by phone today. 

## 2019-04-23 ENCOUNTER — Encounter: Payer: Self-pay | Admitting: Hematology and Oncology

## 2019-04-23 ENCOUNTER — Inpatient Hospital Stay: Payer: Medicare Other | Attending: Hematology and Oncology | Admitting: Hematology and Oncology

## 2019-04-23 DIAGNOSIS — D473 Essential (hemorrhagic) thrombocythemia: Secondary | ICD-10-CM

## 2019-04-23 DIAGNOSIS — D75839 Thrombocytosis, unspecified: Secondary | ICD-10-CM

## 2019-04-23 NOTE — Patient Instructions (Signed)
   Take ferrous sulfate 325 mg by mouth once a day with orange juice or vitamin C.

## 2019-05-10 ENCOUNTER — Other Ambulatory Visit: Payer: Self-pay | Admitting: Family Medicine

## 2019-05-10 DIAGNOSIS — E1142 Type 2 diabetes mellitus with diabetic polyneuropathy: Secondary | ICD-10-CM

## 2019-05-10 NOTE — Telephone Encounter (Signed)
Called [facility] and pharm. Tech stated they never received refill that was sent in 04/19/19. Request resent. Requested Prescriptions  Pending Prescriptions Disp Refills  . Accu-Chek Softclix Lancets lancets [Pharmacy Med Name: [place] 300 each 1    Sig: USE UP TO FOUR TIMES DAILY AS DIRECTED     Endocrinology: Diabetes - Testing Supplies Passed - 05/10/2019  1:19 PM      Passed - Valid encounter within last 12 months    Recent Outpatient Visits          3 weeks ago Type 2 diabetes mellitus with peripheral neuropathy ([place])   [facility], Lupita Raider, FNP   1 month ago Type 2 diabetes mellitus with peripheral neuropathy [facility])   [facility], Lupita Raider, FNP   3 months ago Type 2 diabetes mellitus with peripheral neuropathy [facility])   [place], DO   7 months ago Chronic right shoulder pain   [facility] Mikey [employer], NP   1 year ago Urinary frequency   [facility] Merrilyn Puma, Jerrel Ivory, NP      Future Appointments            In 1 week [place], Lupita Raider, [facility], [facility]

## 2019-05-14 ENCOUNTER — Other Ambulatory Visit: Payer: Self-pay | Admitting: Family Medicine

## 2019-05-14 DIAGNOSIS — I10 Essential (primary) hypertension: Secondary | ICD-10-CM

## 2019-05-14 DIAGNOSIS — E782 Mixed hyperlipidemia: Secondary | ICD-10-CM

## 2019-05-15 ENCOUNTER — Other Ambulatory Visit: Payer: Self-pay | Admitting: Family Medicine

## 2019-05-15 DIAGNOSIS — G8929 Other chronic pain: Secondary | ICD-10-CM

## 2019-05-15 NOTE — Telephone Encounter (Signed)
Requested medication (s) are due for refill today: yes  Requested medication (s) are on the active medication list: yes  Last refill:  03/15/19  Future visit scheduled: no  Notes to clinic:  medication not delegated to NT to refill   Requested Prescriptions  Pending Prescriptions Disp Refills   cyclobenzaprine (FLEXERIL) 10 MG tablet [Pharmacy Med Name: CYCLOBENZAPRINE 10MG  TABLETS] 60 tablet 2    Sig: TAKE 1 TABLET(10 MG) BY MOUTH THREE TIMES DAILY AS NEEDED FOR MUSCLE SPASMS      Not Delegated - Analgesics:  Muscle Relaxants Failed - 05/15/2019  9:44 AM      Failed - This refill cannot be delegated      Passed - Valid encounter within last 6 months    Recent Outpatient Visits           1 month ago Type 2 diabetes mellitus with peripheral neuropathy ([place])   [facility], Lupita Raider, FNP   2 months ago Type 2 diabetes mellitus with peripheral neuropathy [facility])   [facility], Lupita Raider, FNP   3 months ago Type 2 diabetes mellitus with peripheral neuropathy [facility])   [place], DO   7 months ago Chronic right shoulder pain   [facility] [employer]key [employer], NP   1 year ago Urinary frequency   [facility] Merrilyn Puma, Jerrel Ivory, NP

## 2019-05-17 ENCOUNTER — Ambulatory Visit: Payer: Medicare Other | Admitting: Family Medicine

## 2019-05-18 ENCOUNTER — Other Ambulatory Visit: Payer: Self-pay | Admitting: Family Medicine

## 2019-05-18 DIAGNOSIS — E1142 Type 2 diabetes mellitus with diabetic polyneuropathy: Secondary | ICD-10-CM

## 2019-05-18 NOTE — Telephone Encounter (Signed)
Requested Prescriptions  Pending Prescriptions Disp Refills  . metFORMIN (GLUCOPHAGE-XR) 750 MG 24 hr tablet [Pharmacy Med Name: METFORMIN ER 750MG 24HR TABS] 180 tablet 0    Sig: TAKE 1 TABLET BY MOUTH TWICE DAILY     Endocrinology:  Diabetes - Biguanides Failed - 05/18/2019  7:03 AM      Failed - HBA1C is between 0 and 7.9 and within 180 days    Hgb A1c MFr Bld  Date Value Ref Range Status  03/22/2019 8.2 (H) <5.7 % of total Hgb Final    Comment:    For someone without known diabetes, a hemoglobin A1c value of 6.5% or greater indicates that they may have  diabetes and this should be confirmed with a follow-up  test. . For someone with known diabetes, a value <7% indicates  that their diabetes is well controlled and a value  greater than or equal to 7% indicates suboptimal  control. A1c targets should be individualized based on  duration of diabetes, age, comorbid conditions, and  other considerations. . Currently, no consensus exists regarding use of hemoglobin A1c for diagnosis of diabetes for children. .          Passed - Cr in normal range and within 360 days    Creat  Date Value Ref Range Status  03/22/2019 0.94 0.50 - 0.99 mg/dL Final    Comment:    For patients >53 years of age, the reference limit for Creatinine is approximately 13% higher for people identified as African-American. .          Passed - eGFR in normal range and within 360 days    GFR, Est African American  Date Value Ref Range Status  03/22/2019 73 > OR = 60 mL/min/1.40m Final   GFR, Est Non African American  Date Value Ref Range Status  03/22/2019 63 > OR = 60 mL/min/1.721mFinal         Passed - Valid encounter within last 6 months    Recent Outpatient Visits          1 month ago [facility]   2 months [facility]   3 months ago [facility] 2 [facility] with peripheral [facility]  [place]   7 months ago Chronic right shoulder pain   [facility]   1 year ago Urinary frequency   [facility] IvoryNP

## 2019-05-24 ENCOUNTER — Telehealth: Payer: Self-pay

## 2019-05-24 NOTE — Telephone Encounter (Signed)
Copied from [place] [phone]. Topic: General - Inquiry >> May 24, 2019  1:44 PM Virl Axe D wrote: Reason for CRM: Pt's husband stated they believe pt is going to need a hip replacement. They would like to know if xrays can be done prior to scheduling an appt. Advised that appt would most likely be needed first. Declined to schedule at the time

## 2019-05-25 ENCOUNTER — Ambulatory Visit
Admission: RE | Admit: 2019-05-25 | Discharge: 2019-05-25 | Disposition: A | Discharge status: Home / Self Care | Payer: Medicare Other | Source: Home / Self Care | Attending: Family Medicine | Admitting: Family Medicine

## 2019-05-25 ENCOUNTER — Ambulatory Visit (INDEPENDENT_AMBULATORY_CARE_PROVIDER_SITE_OTHER): Payer: Medicare Other | Admitting: Family Medicine

## 2019-05-25 ENCOUNTER — Other Ambulatory Visit: Payer: Self-pay

## 2019-05-25 ENCOUNTER — Encounter: Payer: Self-pay | Admitting: Family Medicine

## 2019-05-25 ENCOUNTER — Other Ambulatory Visit: Payer: Self-pay | Admitting: Family Medicine

## 2019-05-25 ENCOUNTER — Ambulatory Visit: Payer: Medicare Other

## 2019-05-25 ENCOUNTER — Ambulatory Visit
Admission: RE | Admit: 2019-05-25 | Discharge: 2019-05-25 | Disposition: A | Discharge status: Home / Self Care | Payer: Medicare Other | Source: Ambulatory Visit | Attending: Family Medicine | Admitting: Family Medicine

## 2019-05-25 ENCOUNTER — Ambulatory Visit: Payer: Medicare Other | Admitting: Family Medicine

## 2019-05-25 VITALS — BP 120/77 | HR 96 | Temp 97.1°F | Ht 61.0 in | Wt 178.2 lb

## 2019-05-25 DIAGNOSIS — M25552 Pain in left hip: Secondary | ICD-10-CM | POA: Insufficient documentation

## 2019-05-25 DIAGNOSIS — R319 Hematuria, unspecified: Secondary | ICD-10-CM

## 2019-05-25 DIAGNOSIS — B351 Tinea unguium: Secondary | ICD-10-CM | POA: Diagnosis not present

## 2019-05-25 DIAGNOSIS — R3129 Other microscopic hematuria: Secondary | ICD-10-CM

## 2019-05-25 DIAGNOSIS — G8929 Other chronic pain: Secondary | ICD-10-CM

## 2019-05-25 DIAGNOSIS — M1612 Unilateral primary osteoarthritis, left hip: Secondary | ICD-10-CM | POA: Diagnosis not present

## 2019-05-25 DIAGNOSIS — M25511 Pain in right shoulder: Secondary | ICD-10-CM

## 2019-05-25 LAB — POCT URINALYSIS DIPSTICK
Bilirubin, UA: NEGATIVE
Glucose, UA: NEGATIVE
Ketones, UA: NEGATIVE
Leukocytes, UA: NEGATIVE
Nitrite, UA: NEGATIVE
Protein, UA: NEGATIVE
Spec Grav, UA: 1.02 (ref 1.010–1.025)
Urobilinogen, UA: 0.2 E.U./dL
pH, UA: 5 (ref 5.0–8.0)

## 2019-05-25 MED ORDER — TERBINAFINE HCL 250 MG PO TABS: 250.0000 mg | ORAL_TABLET | Freq: Every day | ORAL | 0 refills | Status: DC

## 2019-05-25 MED ORDER — CYCLOBENZAPRINE HCL 10 MG PO TABS: ORAL_TABLET | ORAL | 1 refills | Status: DC

## 2019-05-25 NOTE — Assessment & Plan Note (Signed)
Hematuria noted on POCT U/A in clinic 04/15/2019 and 05/25/2019.  Reports no previous history of hematuria or being told that she had RBC in her urine.   Is current cigarette smoker.  Plan: 1. Referral to urology placed.

## 2019-05-25 NOTE — Patient Instructions (Signed)
As we discussed, I have sent in a referral to Orthopedics for your left hip pain for further evaluation.  I have also sent in a referral to Urology based on small blood shown in your urine from today and 04/15/2019 visit.    If you have not heard anything from Orthopedics or Urology within 1 week about scheduling an appointment, please contact our office and we will follow up on this.  Your medication refills have been sent to your pharmacy on file.  I have also sent in a prescription for Terbinafine.  This medication will treat your toenail fungus.  The course of treatment is over 12 weeks and we will need to have labs drawn every 4 weeks until you have completed treatment.  Medication refills are dependent on you having your labs drawn so we can check on your liver function.  Avoid nail salons for pedicures.  It is best to do your nails at home, especially since you are diabetic.  Once you have completed the 12 weeks of treatment, throw out all nail polish, nail files, nail clippers and buy new to prevent reinfection.  We will plan to see you back in 1 month for follow up on your Diabetes  You will receive a survey after today's visit either digitally by e-mail or paper by Ney mail. Your experiences and feedback matter to [place].  Please respond so we know how we are doing as we provide care for you.  Call us with any questions/concerns/needs.  It is my goal to be available to you for your health concerns.  Thanks for choosing me to be a partner in your healthcare needs!  Harlin Rain, FNP-C Family Nurse Practitioner [facility] Phone: [phone]

## 2019-05-25 NOTE — Assessment & Plan Note (Signed)
Left hip pain x 3 weeks, reports burning sensation and difficulty with ambulation.  No trauma/injury/fall.  Pain on exam with internal/external rotation.  Unable to sleep on left side at night.  Hip etiology based on exam.  Plan: 1. Xray of left hip taken today 2. Discussed can take naproxen 500, 1 tablet 2x per day for pain/discomfort 3. Can use topical diclofenac gel 3-4x per day as needed for hip pain/discomfort 4. Referral placed to Orthopedics

## 2019-05-25 NOTE — Progress Notes (Signed)
Subjective:    Patient ID: Alison Mays, female    DOB: [birth year], 69 y.o.   MRN: [id number]  Alison Mays is a 69 y.o. female presenting on 05/25/2019 for Hip Pain (constant burning sensation in the left hip x 3 weeks. The pain makes its difficulty walking.) and Nail Problem   HPI  Ms. Sedor presents to clinic for evaluation of left hip pain and toenail problem.  Reports approx 3 weeks ago she started to notice some left hip pain, reports as a constant burning sensation and has been having some difficulty with walking due to pain.  Is unable to sleep on her left side at night due to the hip pain.  Has not taken anything to help with her symptoms.  Reports toenail problem that has gone on for a while.  Thickening and discolored toenails.  Does frequent nail salons for pedicures.  Has not tried anything for her symptoms.  Depression screen [facility] 2/9 02/09/2019 07/07/2018 03/26/2018  Decreased Interest 0 0 0  Down, Depressed, Hopeless 0 0 0  PHQ - 2 Score 0 0 0  Altered sleeping - - -  Tired, decreased energy - - -  Change in appetite - - -  Feeling bad or failure about yourself  - - -  Trouble concentrating - - -  Moving slowly or fidgety/restless - - -  Suicidal thoughts - - -  PHQ-9 Score - - -  Difficult doing work/chores - - -    Social History   Tobacco Use  . Smoking status: Current Every Day Smoker    Packs/day: 0.25    Years: 40.00    Pack years: 10.00    Types: Cigarettes  . Smokeless tobacco: Never Used  . Tobacco comment: intermittent smoker over past 40 years, quit at time up to 6 months  Substance Use Topics  . Alcohol use: Never  . Drug use: Never    Review of Systems  Constitutional: Negative.   HENT: Negative.   Eyes: Negative.   Respiratory: Negative.   Cardiovascular: Negative.   Gastrointestinal: Negative.   Endocrine: Negative.   Genitourinary: Negative.   Musculoskeletal: Positive for arthralgias and gait problem. Negative for back pain,  joint swelling, myalgias, neck pain and neck stiffness.  Skin: Negative.        Nail problem  Allergic/Immunologic: Negative.   Hematological: Negative.   Psychiatric/Behavioral: Negative.    Per HPI unless specifically indicated above     Objective:    BP 120/77 (BP Location: Right Arm, Patient Position: Sitting, Cuff Size: Normal)   Pulse 96   Temp (!) 97.1 F (36.2 C) (Temporal)   Ht 5\' 1"  (1.549 m)   Wt 178 lb 3.2 oz (80.8 kg)   BMI 33.67 kg/m   Wt Readings from Last 3 Encounters:  05/25/19 178 lb 3.2 oz (80.8 kg)  04/16/19 180 lb 14.2 oz (82.1 kg)  04/15/19 181 lb 3.2 oz (82.2 kg)    Physical Exam Vitals reviewed.  Constitutional:      General: She is not in acute distress.    Appearance: Normal appearance. She is well-developed and well-groomed. She is obese. She is not ill-appearing or toxic-appearing.  HENT:     Head: Normocephalic.  Eyes:     General: Lids are normal. Vision grossly intact.        Right eye: No discharge.        Left eye: No discharge.     Extraocular Movements: Extraocular movements intact.  Conjunctiva/sclera: Conjunctivae normal.     Pupils: Pupils are equal, round, and reactive to light.  Cardiovascular:     Rate and Rhythm: Normal rate.     Pulses: Normal pulses.  Pulmonary:     [place]: Pulmonary effort is normal. No respiratory distress.  Musculoskeletal:        General: Tenderness present. No swelling or deformity. Normal range of motion.     Right hip: Normal.     Left hip: Tenderness present. No deformity, lacerations or crepitus. Normal range of motion. Normal strength.     Right lower leg: No edema.     Left lower leg: No edema.     Comments: Tenderness with log roll of left leg, internal/external rotation with knee bent.  Unable to lay on left side.  Skin:    General: Skin is warm and dry.     Capillary Refill: Capillary refill takes less than 2 seconds.  Neurological:     General: No focal deficit present.     Mental  Status: She is alert and oriented to person, place, and time.     Cranial Nerves: No cranial nerve deficit.     Sensory: No sensory deficit.     Motor: No weakness.     Coordination: Coordination normal.     Gait: Gait normal.  Psychiatric:        Attention and Perception: Attention and perception normal.        Mood and Affect: Mood and affect normal.        Speech: Speech normal.        Behavior: Behavior normal. Behavior is cooperative.        Thought Content: Thought content normal.        Cognition and Memory: Cognition and memory normal.        Judgment: Judgment normal.     Results for orders placed or performed in visit on 05/25/19  POCT Urinalysis Dipstick  Result Value Ref Range   Color, UA Yellow    Clarity, UA Clear    Glucose, UA Negative Negative   Bilirubin, UA negative    Ketones, UA negative    Spec Grav, UA 1.020 1.010 - 1.025   Blood, UA trace    pH, UA 5.0 5.0 - 8.0   Protein, UA Negative Negative   Urobilinogen, UA 0.2 0.2 or 1.0 E.U./dL   Nitrite, UA negative    Leukocytes, UA Negative Negative   Appearance     Odor        Assessment & Plan:   Problem List Items Addressed This Visit      Musculoskeletal and Integument   Toenail fungus    Reports has gone on a long time.  Long history of pedicures in nail salons.  Reviewed topical and oral medications, in agreement for oral terbinafine.  Discussed will need to do labs every 4 weeks to check liver function before sending in refills on this medication and medication is typically 12 weeks.  Patient in agreement with plan.  Plan: 1. CMP reviewed and AST/ALT within normal limits from 03/22/2019 2. To begin terbinafine 250mg  tablet, 1 tablet daily x 4 weeks 3. To return to clinic in 4 weeks for next lab draw and will send in refill on medication 4. Educated to avoid nail salons for pedicures and once this has cleared up will need to avoid and start fresh with new polishes, nail files, nail clippers to  prevent infection.      Relevant  Medications   terbinafine (LAMISIL) 250 MG tablet     Other   Right shoulder pain (Chronic)   Relevant Medications   cyclobenzaprine (FLEXERIL) 10 MG tablet   Left hip pain    Left hip pain x 3 weeks, reports burning sensation and difficulty with ambulation.  No trauma/injury/fall.  Pain on exam with internal/external rotation.  Unable to sleep on left side at night.  Hip etiology based on exam.  Plan: 1. Xray of left hip taken today 2. Discussed can take naproxen 500, 1 tablet 2x per day for pain/discomfort 3. Can use topical diclofenac gel 3-4x per day as needed for hip pain/discomfort 4. Referral placed to Orthopedics      Relevant Orders   XR HIP UNILAT W OR W/O PELVIS 2-3 VIEWS LEFT   AMB referral to orthopedics   Hematuria - Primary    Hematuria noted on POCT U/A in clinic 04/15/2019 and 05/25/2019.  Reports no previous history of hematuria or being told that she had RBC in her urine.   Is current cigarette smoker.  Plan: 1. Referral to urology placed.      Relevant Orders   Ambulatory referral to Urology    Other Visit Diagnoses    Microscopic hematuria       Relevant Orders   POCT Urinalysis Dipstick (Completed)      Meds ordered this encounter  Medications  . cyclobenzaprine (FLEXERIL) 10 MG tablet    Sig: Take 1 tablet 3x a day as needed for muscle spasms    Dispense:  270 tablet    Refill:  1  . terbinafine (LAMISIL) 250 MG tablet    Sig: Take 1 tablet (250 mg total) by mouth daily.    Dispense:  30 tablet    Refill:  0      Follow up plan: Return in about 4 weeks (around 06/22/2019) for Diabetes, A1C and labs.   Harlin Rain, [place] Family Nurse Practitioner [facility] 05/25/2019, 3:06 PM

## 2019-05-25 NOTE — Assessment & Plan Note (Signed)
Reports has gone on a long time.  Long history of pedicures in nail salons.  Reviewed topical and oral medications, in agreement for oral terbinafine.  Discussed will need to do labs every 4 weeks to check liver function before sending in refills on this medication and medication is typically 12 weeks.  Patient in agreement with plan.  Plan: 1. CMP reviewed and AST/ALT within normal limits from 03/22/2019 2. To begin terbinafine 250mg  tablet, 1 tablet daily x 4 weeks 3. To return to clinic in 4 weeks for next lab draw and will send in refill on medication 4. Educated to avoid nail salons for pedicures and once this has cleared up will need to avoid and start fresh with new polishes, nail files, nail clippers to prevent infection.

## 2019-05-31 ENCOUNTER — Other Ambulatory Visit: Payer: Self-pay | Admitting: Family Medicine

## 2019-05-31 DIAGNOSIS — E1142 Type 2 diabetes mellitus with diabetic polyneuropathy: Secondary | ICD-10-CM

## 2019-05-31 NOTE — Telephone Encounter (Signed)
Requested Prescriptions  Pending Prescriptions Disp Refills  . OZEMPIC, 0.25 OR 0.5 MG/DOSE, 2 MG/1.5ML SOPN [Pharmacy Med Name: OZEMPIC 0.25 OR 0.5MG /DOS 1X2MG  PEN] 1.5 mL     Sig: INJECT 0.5MG  ONCE WEEKLY AS DIRECTED     Endocrinology:  Diabetes - GLP-1 Receptor Agonists Failed - 05/31/2019  3:27 AM      Failed - HBA1C is between 0 and 7.9 and within 180 days    Hgb A1c MFr Bld  Date Value Ref Range Status  03/22/2019 8.2 (H) <5.7 % of total Hgb Final    Comment:    For someone without known diabetes, a hemoglobin A1c value of 6.5% or greater indicates that they may have  diabetes and this should be confirmed with a follow-up  test. . For someone with known diabetes, a value <7% indicates  that their diabetes is well controlled and a value  greater than or equal to 7% indicates suboptimal  control. A1c targets should be individualized based on  duration of diabetes, age, comorbid conditions, and  other considerations. . Currently, no consensus exists regarding use of hemoglobin A1c for diagnosis of diabetes for children. Renella Cunas - Valid encounter within last 6 months    Recent Outpatient Visits          6 days ago Hematuria, unspecified type   [facility], Lupita Raider, FNP   1 month ago Type 2 diabetes mellitus with peripheral neuropathy [facility])   [facility], Lupita Raider, FNP   2 months ago Type 2 diabetes mellitus with peripheral neuropathy [facility])   [facility], Lupita Raider, FNP   3 months ago Type 2 diabetes mellitus with peripheral neuropathy [facility])   [place], DO   8 months ago Chronic right shoulder pain   [facility] Merrilyn Puma, Jerrel Ivory, NP      Future Appointments            In 1 week [facility] [place], Herbert Seta, [place]

## 2019-06-01 ENCOUNTER — Telehealth: Payer: Self-pay

## 2019-06-01 NOTE — Telephone Encounter (Signed)
Xray does not show any acute fracture or avulsion.  Shows some mild bone spurring and some pubis symphysis, which can be contributing to her pain, but would require follow up with Orthopedics.  I had put the referral in during her 05/25/19 office visit.  Has she heard from Orthopedics for evaluation?  Thanks

## 2019-06-01 NOTE — Telephone Encounter (Signed)
The pt was notified of her xray results. She verbalize understanding, no questioning or concerns.

## 2019-06-01 NOTE — Telephone Encounter (Signed)
Copied from [place] [id number]. Topic: General - Other >> May 31, 2019  1:50 PM Yvette Rack wrote: Reason for CRM: Pt called for x-ray results. Pt requests call back

## 2019-06-03 ENCOUNTER — Inpatient Hospital Stay: Payer: Medicare Other

## 2019-06-08 ENCOUNTER — Ambulatory Visit: Payer: Medicare Other | Admitting: Urology

## 2019-06-18 ENCOUNTER — Telehealth: Payer: Self-pay | Admitting: Family Medicine

## 2019-06-18 NOTE — Telephone Encounter (Signed)
Patient needs new test strip Rx- Accuvcheck guide

## 2019-06-18 NOTE — Telephone Encounter (Signed)
Medication Refill - Medication: "Accu Check Guide 50 test strips"  Has the patient contacted their pharmacy? Yes.   (Agent: If no, request that the patient contact the pharmacy for the refill.) (Agent: If yes, when and what did the pharmacy advise?)  Preferred Pharmacy (with phone number or street name):  [facility] [place], [place] - [address] [place] [address]  [address] [place] [address] [place] [address]  Phone: [phone] Fax: [phone]     Agent: Please be advised that RX refills may take up to 3 business days. We ask that you follow-up with your pharmacy.

## 2019-06-21 ENCOUNTER — Other Ambulatory Visit: Payer: Self-pay | Admitting: Family Medicine

## 2019-06-21 DIAGNOSIS — E1142 Type 2 diabetes mellitus with diabetic polyneuropathy: Secondary | ICD-10-CM

## 2019-06-21 MED ORDER — GLUCOSE BLOOD VI STRP: ORAL_STRIP | 12 refills | Status: AC

## 2019-06-26 ENCOUNTER — Other Ambulatory Visit: Payer: Self-pay | Admitting: Family Medicine

## 2019-06-26 DIAGNOSIS — E1142 Type 2 diabetes mellitus with diabetic polyneuropathy: Secondary | ICD-10-CM

## 2019-06-26 NOTE — Telephone Encounter (Signed)
Approved per protocol. Next OV 07/15/19

## 2019-07-07 ENCOUNTER — Other Ambulatory Visit: Payer: Self-pay | Admitting: Family Medicine

## 2019-07-07 DIAGNOSIS — G629 Polyneuropathy, unspecified: Secondary | ICD-10-CM

## 2019-07-07 MED ORDER — GABAPENTIN 300 MG PO CAPS: ORAL_CAPSULE | ORAL | 1 refills | Status: DC

## 2019-07-07 NOTE — Telephone Encounter (Signed)
RX REFILL gabapentin (NEURONTIN) 300 MG capsule [[id number]]  PHARMACY [id number] - [place],  - [address] [place] [address] Phone:  [phone]  Fax:  [phone]

## 2019-07-15 ENCOUNTER — Other Ambulatory Visit: Payer: Medicare Other

## 2019-07-15 ENCOUNTER — Ambulatory Visit: Payer: Medicare Other | Admitting: Hematology and Oncology

## 2019-07-17 ENCOUNTER — Other Ambulatory Visit: Payer: Self-pay | Admitting: Family Medicine

## 2019-07-17 DIAGNOSIS — E1142 Type 2 diabetes mellitus with diabetic polyneuropathy: Secondary | ICD-10-CM

## 2019-07-17 NOTE — Telephone Encounter (Signed)
Requested medication (s) are due for refill today: yes  Requested medication (s) are on the active medication list: yes  Last refill:  04/19/19  Future visit scheduled: no  Notes to clinic:  Overdue labwork//last Hgb A1C 03/22/19 =8.2%   Requested Prescriptions  Pending Prescriptions Disp Refills   glipiZIDE (GLUCOTROL) 10 MG tablet [Pharmacy Med Name: GLIPIZIDE 10MG  TABLETS] 180 tablet 0    Sig: TAKE 1 TABLET(10 MG) BY MOUTH TWICE DAILY BEFORE A MEAL      Endocrinology:  Diabetes - Sulfonylureas Failed - 07/17/2019  7:20 AM      Failed - HBA1C is between 0 and 7.9 and within 180 days    Hgb A1c MFr Bld  Date Value Ref Range Status  03/22/2019 8.2 (H) <5.7 % of total Hgb Final    Comment:    For someone without known diabetes, a hemoglobin A1c value of 6.5% or greater indicates that they may have  diabetes and this should be confirmed with a follow-up  test. . For someone with known diabetes, a value <7% indicates  that their diabetes is well controlled and a value  greater than or equal to 7% indicates suboptimal  control. A1c targets should be individualized based on  duration of diabetes, age, comorbid conditions, and  other considerations. . Currently, no consensus exists regarding use of hemoglobin A1c for diagnosis of diabetes for children. Renella Cunas - Valid encounter within last 6 months    Recent Outpatient Visits           1 month ago Hematuria, unspecified type   [facility], Lupita Raider, FNP   3 months ago Type 2 diabetes mellitus with peripheral neuropathy [facility])   [facility], Lupita Raider, FNP   4 months ago Type 2 diabetes mellitus with peripheral neuropathy [facility])   [facility], Lupita Raider, FNP   5 months ago Type 2 diabetes mellitus with peripheral neuropathy [facility])   [place], DO   9 months ago Chronic right shoulder pain   [facility] [employer], NP

## 2019-07-23 ENCOUNTER — Other Ambulatory Visit: Payer: Self-pay | Admitting: Family Medicine

## 2019-07-23 DIAGNOSIS — E1142 Type 2 diabetes mellitus with diabetic polyneuropathy: Secondary | ICD-10-CM

## 2019-08-09 ENCOUNTER — Encounter: Payer: Self-pay | Admitting: Family Medicine

## 2019-08-09 ENCOUNTER — Ambulatory Visit (INDEPENDENT_AMBULATORY_CARE_PROVIDER_SITE_OTHER): Payer: Medicare Other | Admitting: Family Medicine

## 2019-08-09 ENCOUNTER — Other Ambulatory Visit: Payer: Self-pay

## 2019-08-09 VITALS — BP 146/90 | HR 98 | Temp 97.1°F | Resp 16 | Ht 61.0 in | Wt 177.0 lb

## 2019-08-09 DIAGNOSIS — M25552 Pain in left hip: Secondary | ICD-10-CM | POA: Diagnosis not present

## 2019-08-09 DIAGNOSIS — E1142 Type 2 diabetes mellitus with diabetic polyneuropathy: Secondary | ICD-10-CM

## 2019-08-09 DIAGNOSIS — G629 Polyneuropathy, unspecified: Secondary | ICD-10-CM

## 2019-08-09 DIAGNOSIS — R0602 Shortness of breath: Secondary | ICD-10-CM | POA: Diagnosis not present

## 2019-08-09 DIAGNOSIS — B351 Tinea unguium: Secondary | ICD-10-CM

## 2019-08-09 DIAGNOSIS — I10 Essential (primary) hypertension: Secondary | ICD-10-CM

## 2019-08-09 DIAGNOSIS — R319 Hematuria, unspecified: Secondary | ICD-10-CM

## 2019-08-09 LAB — POCT GLYCOSYLATED HEMOGLOBIN (HGB A1C): Hemoglobin A1C: 7.6 % — AB (ref 4.0–5.6)

## 2019-08-09 MED ORDER — GABAPENTIN 300 MG PO CAPS: ORAL_CAPSULE | ORAL | 1 refills | Status: DC

## 2019-08-09 MED ORDER — NAPROXEN 500 MG PO TABS: 500.0000 mg | ORAL_TABLET | Freq: Two times a day (BID) | ORAL | 0 refills | Status: AC

## 2019-08-09 MED ORDER — ALBUTEROL SULFATE HFA 108 (90 BASE) MCG/ACT IN AERS: INHALATION_SPRAY | RESPIRATORY_TRACT | 5 refills | Status: DC

## 2019-08-09 NOTE — Assessment & Plan Note (Signed)
Left hip pain, has been referred to Orthopedics as of 05/25/2019 visit.  Reports was unable to schedule with [facility] did not have childcare and was informed would be unable to bring her granddaughter.  Discussed importance of meeting with Orthopedics.  Patient in agreement, provided with contact information for [facility] and for [facility] in [place] with their urgent care walk in hours.  Plan: 1. Schedule appointment with [place] for Orthopedic evaluation 2. Can take Naproxen 500mg  twice daily with food.  To avoid all other NSAIDs (ibuprofen, motrin, advil, aleve, goody's, bc powder) while taking naproxen 3. Follow up PRN

## 2019-08-09 NOTE — Assessment & Plan Note (Signed)
ControlledDM with A1c 7.6% improved from 8.2% on 03/22/2019 and goal A1c < 7.0%.  Reports has stopped taking Ozempic 0.5mg  weekly due to burning of abdomen with administration.  Reviewed other injection sites such as top of thighs and back of arm.  With improvement of A1C without Ozempic, will hold until next follow up visit in 3 months, per patient request.  - Complications - peripheral neuropathy, obesity, hyperlipidemia  Plan:  1. Continue current therapy: metformin XR 750mg  daily, stop ozempic 0.5mg  weekly 2. Encourage improved lifestyle: - low carb/low glycemic diet reinforced prior education - Increase physical activity to 30 minutes most days of the week.  Explained that increased physical activity increases body's use of sugar for energy. 3. Check fasting am CBG and log these.  Bring log to next visit for review 4. Continue ARB and Statin 5. Up to date on foot exam and diabetic eye exam 6. Follow-up 3 months

## 2019-08-09 NOTE — Assessment & Plan Note (Signed)
Completed 1 month of treatment with terbinafine and reports good results, no interest in completing full 8-12 weeks of treatment.  Plan: 1. Follow up PRN

## 2019-08-09 NOTE — Assessment & Plan Note (Signed)
Uncontrolled hypertension.  BP goal < 130/80.  Patient reports blood pressure readings have been good at home, has been having increase in BP due to increase in left hip pain.   Pt is not currently working on lifestyle modifications.  Taking medications tolerating well without side effects.   Plan: 1. Continue amlodipine 5mg  daily, chlorthalidone 25mg  daily, losartan 100mg  daily and metoprolol 50mg  twice daily 2. Obtain labs before next visit 3. Encouraged heart healthy diet and increasing exercise to 30 minutes most days of the week. 4. Check BP 1-2 x per week at home, keep log, and bring to clinic at next appointment. 5. Follow up 3 months

## 2019-08-09 NOTE — Patient Instructions (Addendum)
[facility] to schedule new patient appointment [phone]  CONTINUE losartan 100mg  once daily.    Some of the possible side effects are:   - angioedema: swelling of lips, mouth, and tongue.  If this rare side effect occurs, please go to ED.  - cough: you could develop a dry, hacking cough caused by this medicine.  If it occurs, it will go away after stopping this medicine.  Call the clinic before stopping the medication.  - kidney damage: we will monitor your labs when we start this medicine and at least one time per year.  If you do not have an change in kidney function when starting this medicine, it will provide kidney protection over time.   Contact [facility] in [place], [place] for your left hip pain for an appointment  Can continue to hold the Ozempic dosing until your next visit, as your A1C has improved from 8.2% to 7.6%.  We will recheck your A1C and labs in 3 months and if there is an increase in your A1C we can discuss restarting.  Try to get exercise a minimum of 30 minutes per day at least 5 days per week as well as  adequate water intake all while measuring blood pressure a few times per week.  Keep a blood pressure log and bring back to clinic at your next visit.  If your readings are consistently over 130/80 to contact our office/send me a MyChart message and we will see you sooner.  Can try DASH and Mediterranean diet options, avoiding processed foods, lowering sodium intake, avoiding pork products, and eating a plant based diet for optimal health.  You can learn more information online about your diabetes at American Diabetes Association: http://www.diabetes.org/ - General self-care (diet, medications, blood sugar checks). - Diet recommendations - There are even recipes available for you to look at and try.  We will plan to see you back in 3 months for hypertension and diabetes follow up visit  You will receive a survey after  today's visit either digitally by e-mail or paper by [place] mail. Your experiences and feedback matter to [place].  Please respond so we know how we are doing as we provide care for you.  Call us with any questions/concerns/needs.  It is my goal to be available to you for your health concerns.  Thanks for choosing me to be a partner in your healthcare needs!  Harlin Rain, FNP-C Family Nurse Practitioner [facility] Phone: [phone]

## 2019-08-09 NOTE — Progress Notes (Signed)
Subjective:    Patient ID: Alison Mays, female    DOB: 1950/10/26, 69 y.o.   MRN: [id number]  Alison Mays is a 69 y.o. female presenting on 08/09/2019 for Hip Pain (constant left hip pain. Walking, prolong sitting and standing make the pain worse x 2 mths Pt state she was unable to go to PT because she didn't have childcare for her daughter.)   HPI  Health Maintenance:  Alison Mays presents to clinic for follow up visit on her left hip pain, diabetes and hypertension.  Diabetes Pt presents today for follow up Type 2 Diabetes Mellitus.  He/she (caps): She ACTION; IS/IS NOT: is not checking AM CBG at home. -Current diabetic medications include: metformin XR 750mg  daily and ozempic 0.5mg  weekly.  Reports has stopped Ozempic due to feeling of burning in abdomen with injections. -ACTION; IS/IS NOT: is not currently symptomatic -Actions; denies/reports/admits to: denies polydipsia, polyphagia, polyuria, headaches, diaphoresis, shakiness, chills, pain, numbness or tingling in extremities or changes in vision -Clinical course has been improving  -Reports no structured exercise routine -Diet is high in salt, high in fat, and high in carbohydrates  PREVENTION Eye exam current (within 1 year) Up to date Foot exam current (within 1 year) Up to date Lipid/ASCVD risk reduction - on statin: YES/NO: Yes  Kidney Protection (On ACE/ARB)? YES/NO: Yes    Hypertension - She is checking BP at home or outside of clinic.  Readings reported as <130/80 - Current medications: amlodipine 5mg  daily, chlorthalidone 25mg  daily, losartan 100mg  daily and metoprolol 50mg  twice daily, tolerating well without side effects - She is not currently symptomatic. - Pt denies headache, lightheadedness, dizziness, changes in vision, chest tightness/pressure, palpitations, leg swelling, sudden loss of speech or loss of consciousness. - She  reports no regular exercise routine. - Her diet is high in salt, high in  fat, and high in carbohydrates.  Depression screen [name] 2/9 02/09/2019 07/07/2018 03/26/2018  Decreased Interest 0 0 0  Down, Depressed, Hopeless 0 0 0  PHQ - 2 Score 0 0 0  Altered sleeping - - -  Tired, decreased energy - - -  Change in appetite - - -  Feeling bad or failure about yourself  - - -  Trouble concentrating - - -  Moving slowly or fidgety/restless - - -  Suicidal thoughts - - -  PHQ-9 Score - - -  Difficult doing work/chores - - -    Social History   Tobacco Use  . Smoking status: Current Every Day Smoker    Packs/day: 0.25    Years: 40.00    Pack years: 10.00    Types: Cigarettes  . Smokeless tobacco: Never Used  . Tobacco comment: intermittent smoker over past 40 years, quit at time up to 6 months  Vaping Use  . Vaping Use: Never used  Substance Use Topics  . Alcohol use: Never  . Drug use: Never    Review of Systems  Constitutional: Negative.   HENT: Negative.   Eyes: Negative.   Respiratory: Negative.   Cardiovascular: Negative.   Gastrointestinal: Negative.   Endocrine: Negative.   Genitourinary: Negative.   Musculoskeletal: Positive for arthralgias. Negative for back pain, gait problem, joint swelling, myalgias, neck pain and neck stiffness.  Skin: Negative.   Allergic/Immunologic: Negative.   Neurological: Negative.   Hematological: Negative.   Psychiatric/Behavioral: Negative.    Per HPI unless specifically indicated above     Objective:    BP (!) 146/90 (BP Location:  Left Arm, Patient Position: Sitting, Cuff Size: Normal)   Pulse 98   Temp (!) 97.1 F (36.2 C) (Temporal)   Resp 16   Ht 5\' 1"  (1.549 m)   Wt 177 lb (80.3 kg)   SpO2 97%   BMI 33.44 kg/m   Wt Readings from Last 3 Encounters:  08/09/19 177 lb (80.3 kg)  05/25/19 178 lb 3.2 oz (80.8 kg)  04/16/19 180 lb 14.2 oz (82.1 kg)    Physical Exam Vitals reviewed.  Constitutional:      General: She is not in acute distress.    Appearance: Normal appearance. She is  well-developed and well-groomed. She is obese. She is not ill-appearing or toxic-appearing.  HENT:     Head: Normocephalic and atraumatic.     Nose:     Comments: Alison Mays is in place, covering mouth and nose. Eyes:     General: Lids are normal. Vision grossly intact.        Right eye: No discharge.        Left eye: No discharge.     Extraocular Movements: Extraocular movements intact.     Conjunctiva/sclera: Conjunctivae normal.     Pupils: Pupils are equal, round, and reactive to light.  Cardiovascular:     Rate and Rhythm: Normal rate and regular rhythm.     Pulses: Normal pulses.          Dorsalis pedis pulses are 2+ on the right side and 2+ on the left side.     Heart sounds: Normal heart sounds. No murmur heard.  No friction rub. No gallop.   Pulmonary:     Effort: Pulmonary effort is normal. No respiratory distress.     Breath sounds: Normal breath sounds.  Musculoskeletal:        General: Tenderness present. No swelling.     Right hip: Normal.     Left hip: Tenderness present. No deformity or crepitus. Decreased range of motion. Normal strength.     Right lower leg: No edema.     Left lower leg: No edema.  Skin:    General: Skin is warm and dry.     Capillary Refill: Capillary refill takes less than 2 seconds.  Neurological:     General: No focal deficit present.     Mental Status: She is alert and oriented to person, place, and time.  Psychiatric:        Attention and Perception: Attention and perception normal.        Mood and Affect: Mood and affect normal.        Speech: Speech normal.        Behavior: Behavior normal. Behavior is cooperative.        Thought Content: Thought content normal.        Cognition and Memory: Cognition and memory normal.        Judgment: Judgment normal.    Results for orders placed or performed in visit on 08/09/19  POCT HgB A1C  Result Value Ref Range   Hemoglobin A1C 7.6 (A) 4.0 - 5.6 %   HbA1c POC (<> result, manual entry)      HbA1c, POC (prediabetic range)     HbA1c, POC (controlled diabetic range)        Assessment & Plan:   Problem List Items Addressed This Visit      Cardiovascular and Mediastinum   Hypertension    Uncontrolled hypertension.  BP goal < 130/80.  Patient reports blood pressure readings have been  good at home, has been having increase in BP due to increase in left hip pain.   Pt is not currently working on lifestyle modifications.  Taking medications tolerating well without side effects.   Plan: 1. Continue amlodipine 5mg  daily, chlorthalidone 25mg  daily, losartan 100mg  daily and metoprolol 50mg  twice daily 2. Obtain labs before next visit 3. Encouraged heart healthy diet and increasing exercise to 30 minutes most days of the week. 4. Check BP 1-2 x per week at home, keep log, and bring to clinic at next appointment. 5. Follow up 3 months        Endocrine   Type 2 diabetes mellitus with peripheral neuropathy ([place])    ControlledDM with A1c 7.6% improved from 8.2% on 03/22/2019 and goal A1c < 7.0%.  Reports has stopped taking Ozempic 0.5mg  weekly due to burning of abdomen with administration.  Reviewed other injection sites such as top of thighs and back of arm.  With improvement of A1C without Ozempic, will hold until next follow up visit in 3 months, per patient request.  - Complications - peripheral neuropathy, obesity, hyperlipidemia  Plan:  1. Continue current therapy: metformin XR 750mg  daily, stop ozempic 0.5mg  weekly 2. Encourage improved lifestyle: - low carb/low glycemic diet reinforced prior education - Increase physical activity to 30 minutes most days of the week.  Explained that increased physical activity increases body's use of sugar for energy. 3. Check fasting am CBG and log these.  Bring log to next visit for review 4. Continue ARB and Statin 5. Up to date on foot exam and diabetic eye exam 6. Follow-up 3 months      Relevant Medications   gabapentin (NEURONTIN) 300  MG capsule   Other Relevant Orders   POCT HgB A1C (Completed)     Musculoskeletal and Integument   Toenail fungus    Completed 1 month of treatment with terbinafine and reports good results, no interest in completing full 8-12 weeks of treatment.  Plan: 1. Follow up PRN        Other   Left hip pain - Primary    Left hip pain, has been referred to Orthopedics as of 05/25/2019 visit.  Reports was unable to schedule with [facility], did not have childcare and was informed would be unable to bring her granddaughter.  Discussed importance of meeting with Orthopedics.  Patient in agreement, provided with contact information for [facility] and for [facility] in [place] with their urgent care walk in hours.  Plan: 1. Schedule appointment with [place] for Orthopedic evaluation 2. Can take Naproxen 500mg  twice daily with food.  To avoid all other NSAIDs (ibuprofen, motrin, advil, aleve, goody's, bc powder) while taking naproxen 3. Follow up PRN      Relevant Medications   naproxen (NAPROSYN) 500 MG tablet   Hematuria    Referral to Urology on 05/25/2019, patient scheduled then cancelled appointment.  Discussed with patient importance of rescheduling this appointment, as her last two in office POCT with trace hematuria and her being a current cigarette smoker.  Patient verbalized understanding and will reschedule this appointment.  Plan: 1. Contact urology and schedule new patient appointment, information provided       Other Visit Diagnoses    Neuropathy       Relevant Medications   gabapentin (NEURONTIN) 300 MG capsule   Shortness of breath       Relevant Medications   albuterol (VENTOLIN HFA) 108 (90 Base) MCG/ACT inhaler  Meds ordered this encounter  Medications  . gabapentin (NEURONTIN) 300 MG capsule    Sig: Take 1 pill three times a day.    Dispense:  90 capsule    Refill:  1  . albuterol (VENTOLIN HFA) 108 (90 Base) MCG/ACT inhaler    Sig: INHALE 1 TO 2 PUFFS  BY MOUTH INTO THE LUNGS EVERY 6 HOURS AS NEEDED FOR SHORTNESS OF BREATH    Dispense:  18 g    Refill:  5  . naproxen (NAPROSYN) 500 MG tablet    Sig: Take 1 tablet (500 mg total) by mouth 2 (two) times daily with a meal.    Dispense:  30 tablet    Refill:  0      Follow up plan: Return in about 3 months (around 11/09/2019) for DM, A1C, HTN F/U.   Harlin Rain, [place] Family Nurse Practitioner [facility] 08/09/2019, 10:18 AM

## 2019-08-09 NOTE — Assessment & Plan Note (Signed)
Referral to Urology on 05/25/2019, patient scheduled then cancelled appointment.  Discussed with patient importance of rescheduling this appointment, as her last two in office POCT with trace hematuria and her being a current cigarette smoker.  Patient verbalized understanding and will reschedule this appointment.  Plan: 1. Contact urology and schedule new patient appointment, information provided

## 2019-08-16 ENCOUNTER — Other Ambulatory Visit: Payer: Self-pay | Admitting: Family Medicine

## 2019-08-16 DIAGNOSIS — I10 Essential (primary) hypertension: Secondary | ICD-10-CM

## 2019-08-16 DIAGNOSIS — E782 Mixed hyperlipidemia: Secondary | ICD-10-CM

## 2019-08-16 DIAGNOSIS — E1142 Type 2 diabetes mellitus with diabetic polyneuropathy: Secondary | ICD-10-CM

## 2019-09-01 ENCOUNTER — Other Ambulatory Visit: Payer: Self-pay | Admitting: Family Medicine

## 2019-09-01 DIAGNOSIS — R0602 Shortness of breath: Secondary | ICD-10-CM

## 2019-09-02 NOTE — Telephone Encounter (Signed)
Requested  medications are  due for refill today yes  Requested medications are on the active medication list yes  Last refill 08/09/19  Last visit 07/2019  Future visit scheduled no  Notes to clinic Rx written on day of office visit is for 18g, this is for 25.5g. Please assess.

## 2019-10-04 ENCOUNTER — Telehealth: Payer: Self-pay | Admitting: Family Medicine

## 2019-10-04 ENCOUNTER — Other Ambulatory Visit: Payer: Self-pay | Admitting: Family Medicine

## 2019-10-04 DIAGNOSIS — G629 Polyneuropathy, unspecified: Secondary | ICD-10-CM

## 2019-10-04 DIAGNOSIS — I10 Essential (primary) hypertension: Secondary | ICD-10-CM

## 2019-10-04 MED ORDER — METOPROLOL TARTRATE 50 MG PO TABS: ORAL_TABLET | ORAL | 1 refills | Status: AC

## 2019-10-04 MED ORDER — AMLODIPINE BESYLATE 5 MG PO TABS: ORAL_TABLET | ORAL | 1 refills | Status: AC

## 2019-10-04 MED ORDER — GABAPENTIN 300 MG PO CAPS: ORAL_CAPSULE | ORAL | 1 refills | Status: AC

## 2019-10-04 NOTE — Telephone Encounter (Signed)
Medication Refill - Medication: gabapentin (NEURONTIN) 300 MG capsule   metoprolol tartrate (LOPRESSOR) 50 MG tablet   amLODipine (NORVASC) 5 MG tablet  Pt has moved and is still trying to locate a provider but needs refills for the meds above sent to [place] / please advise   Has the patient contacted their pharmacy? No. (Agent: If no, request that the patient contact the pharmacy for the refill.) (Agent: If yes, when and what did the pharmacy advise?)  Preferred Pharmacy (with phone number or street name):  [facility] [place], [place] - [address] [place] [address] Phone:  [phone]  Fax:  [phone]       Agent: Please be advised that RX refills may take up to 3 business days. We ask that you follow-up with your pharmacy.

## 2019-10-05 ENCOUNTER — Other Ambulatory Visit: Payer: Self-pay | Admitting: Family Medicine

## 2019-10-05 DIAGNOSIS — E1142 Type 2 diabetes mellitus with diabetic polyneuropathy: Secondary | ICD-10-CM

## 2019-10-06 DIAGNOSIS — E119 Type 2 diabetes mellitus without complications: Secondary | ICD-10-CM | POA: Diagnosis not present

## 2019-10-15 ENCOUNTER — Other Ambulatory Visit: Payer: Self-pay | Admitting: Family Medicine

## 2019-10-15 DIAGNOSIS — E1142 Type 2 diabetes mellitus with diabetic polyneuropathy: Secondary | ICD-10-CM

## 2019-10-15 NOTE — Telephone Encounter (Signed)
Requested Prescriptions  Pending Prescriptions Disp Refills  . glipiZIDE (GLUCOTROL) 10 MG tablet [Pharmacy Med Name: GLIPIZIDE 10MG  TABLETS] 180 tablet 0    Sig: TAKE 1 TABLET(10 MG) BY MOUTH TWICE DAILY BEFORE A MEAL     Endocrinology:  Diabetes - Sulfonylureas Passed - 10/15/2019  7:11 AM      Passed - HBA1C is between 0 and 7.9 and within 180 days    Hemoglobin A1C  Date Value Ref Range Status  08/09/2019 7.6 (A) 4.0 - 5.6 % Final   Hgb A1c MFr Bld  Date Value Ref Range Status  03/22/2019 8.2 (H) <5.7 % of total Hgb Final    Comment:    For someone without known diabetes, a hemoglobin A1c value of 6.5% or greater indicates that they may have  diabetes and this should be confirmed with a follow-up  test. . For someone with known diabetes, a value <7% indicates  that their diabetes is well controlled and a value  greater than or equal to 7% indicates suboptimal  control. A1c targets should be individualized based on  duration of diabetes, age, comorbid conditions, and  other considerations. . Currently, no consensus exists regarding use of hemoglobin A1c for diagnosis of diabetes for children. Renella Cunas - Valid encounter within last 6 months    Recent Outpatient Visits          2 months ago Left hip pain   [facility], Lupita Raider, FNP   4 months ago Hematuria, unspecified type   [facility], Lupita Raider, FNP   6 months ago Type 2 diabetes mellitus with peripheral neuropathy [facility])   [facility], Lupita Raider, FNP   7 months ago Type 2 diabetes mellitus with peripheral neuropathy [facility])   [facility], Lupita Raider, FNP   8 months ago Type 2 diabetes mellitus with peripheral neuropathy [facility])   [facility], Devonne Doughty, DO      Future Appointments            In 4 days [facility], [facility]

## 2019-10-16 ENCOUNTER — Other Ambulatory Visit: Payer: Self-pay | Admitting: Family Medicine

## 2019-10-16 DIAGNOSIS — E1142 Type 2 diabetes mellitus with diabetic polyneuropathy: Secondary | ICD-10-CM

## 2019-10-19 ENCOUNTER — Ambulatory Visit (INDEPENDENT_AMBULATORY_CARE_PROVIDER_SITE_OTHER): Payer: Medicare Other

## 2019-10-19 VITALS — Ht 62.0 in | Wt 180.0 lb

## 2019-10-19 DIAGNOSIS — Z0000 Encounter for general adult medical examination without abnormal findings: Secondary | ICD-10-CM

## 2019-10-19 NOTE — Progress Notes (Addendum)
I connected with Alison Mays today by telephone and verified that I am speaking with the correct person using two identifiers. Location patient: home Location provider: work Persons participating in the virtual visit: Torii, Royse LPN.   I discussed the limitations, risks, security and privacy concerns of performing an evaluation and management service by telephone and the availability of in person appointments. I also discussed with the patient that there may be a patient responsible charge related to this service. The patient expressed understanding and verbally consented to this telephonic visit.    Interactive audio and video telecommunications were attempted between this provider and patient, however failed, due to patient having technical difficulties OR patient did not have access to video capability.  We continued and completed visit with audio only.     Vital signs may be patient reported or missing.  Subjective:   Alison Mays is a 69 y.o. female who presents for Medicare Annual (Subsequent) preventive examination.  Review of Systems     Cardiac Risk Factors include: advanced age (>39mn, >>41women);diabetes mellitus;dyslipidemia;hypertension;obesity (BMI >30kg/m2);sedentary lifestyle;smoking/ tobacco exposure     Objective:    Today's Vitals   10/19/19 1436 10/19/19 1437  Weight: 180 lb (81.6 kg)   Height: _0  (1.575 m)   PainSc:  9    Body mass index is 32.92 kg/m.  Advanced Directives 10/19/2019 04/16/2019 07/07/2018 01/05/2018 07/01/2017  Does Patient Have a Medical Advance Directive? _1   Would patient like information on creating a medical advance directive? - No - Patient declined - No - Patient declined Yes (MAU/Ambulatory/Procedural Areas - Information given)    Current Medications (verified) Outpatient Encounter Medications as of 10/19/2019  Medication Sig  . Accu-Chek Softclix Lancets lancets USE UP TO FOUR TIMES DAILY  AS DIRECTED  . albuterol (VENTOLIN HFA) 108 (90 Base) MCG/ACT inhaler INHALE 1 TO 2 PUFFS BY MOUTH INTO THE LUNGS EVERY 6 HOURS AS NEEDED FOR SHORTNESS OF BREATH  . amLODipine (NORVASC) 5 MG tablet TAKE 1 TABLET(5 MG) BY MOUTH DAILY  . blood glucose meter kit and supplies KIT Dispense based on patient and insurance preference. Use up to four times daily as directed. (FOR ICD-9 250.00, 250.01).  . chlorthalidone (HYGROTON) 25 MG tablet TAKE 1 TABLET(25 MG) BY MOUTH DAILY  . cyclobenzaprine (FLEXERIL) 10 MG tablet Take 1 tablet 3x a day as needed for muscle spasms  . gabapentin (NEURONTIN) 300 MG capsule Take 1 pill three times a day.  .Marland KitchenglipiZIDE (GLUCOTROL) 10 MG tablet TAKE 1 TABLET(10 MG) BY MOUTH TWICE DAILY BEFORE A MEAL  . glucose blood test strip Use as instructed  . Insulin Pen Needle (BD PEN NEEDLE NANO 2ND GEN) 32G X 4 MM MISC To apply to Ozempic pen needle weekly.  .Marland Kitchenlosartan (COZAAR) 100 MG tablet TAKE 1 TABLET(100 MG) BY MOUTH DAILY  . metFORMIN (GLUCOPHAGE-XR) 750 MG 24 hr tablet Take 1 tablet (750 mg total) by mouth daily with breakfast. Give w/food.  .Glory RosebushVERIO test strip USE 1 STRIP TO TEST TWICE DAILY AS DIRECTED  . OZEMPIC, 0.25 OR 0.5 MG/DOSE, 2 MG/1.5ML SOPN INJECT 0.5MG UNDER THE SKIN ONCE WEEKLY AS DIRECTED  . rosuvastatin (CRESTOR) 40 MG tablet TAKE 1 TABLET(40 MG) BY MOUTH DAILY  . metoprolol tartrate (LOPRESSOR) 50 MG tablet TAKE 1 TABLET(50 MG) BY MOUTH TWICE DAILY (Patient not taking: Reported on 10/19/2019)  . naproxen (NAPROSYN) 500 MG tablet Take 1 tablet (500 mg total) by mouth 2 (  two) times daily with a meal. (Patient not taking: Reported on 10/19/2019)   No facility-administered encounter medications on file as of 10/19/2019.    Allergies (verified) Patient has no known allergies.   History: Past Medical History:  Diagnosis Date  . Diabetes mellitus without complication ([place])   . Hyperlipidemia   . Hypertension    Past Surgical History:  Procedure  Laterality Date  . ABDOMINAL HYSTERECTOMY    . COLONOSCOPY WITH PROPOFOL N/A 01/05/2018   Procedure: COLONOSCOPY WITH PROPOFOL;  Surgeon: Jonathon Bellows, MD;  Location: [facility] ENDOSCOPY;  Service: Gastroenterology;  Laterality: N/A;   Family History  Problem Relation Age of Onset  . Healthy Mother   . Diabetes Father   . Heart attack Sister   . Diabetes Brother   . Diabetes Sister   . Stomach cancer Sister   . Healthy Son   . Healthy Daughter    Social History   Socioeconomic History  . Marital status: Married    Spouse name: Not on file  . Number of children: Not on file  . Years of education: Not on file  . Highest education level: High school graduate  Occupational History  . Occupation: retired  Tobacco Use  . Smoking status: Current Every Day Smoker    Packs/day: 0.25    Years: 40.00    Pack years: 10.00    Types: Cigarettes  . Smokeless tobacco: Never Used  . Tobacco comment: intermittent smoker over past 40 years, quit at time up to 6 months  Vaping Use  . Vaping Use: Never used  Substance and Sexual Activity  . Alcohol use: Never  . Drug use: Never  . Sexual activity: Not on file  Other Topics Concern  . Not on file  Social History Narrative  . Not on file   Social Determinants of Health   Financial Resource Strain: Low Risk   . Difficulty of Paying Living Expenses: Not hard at all  Food Insecurity: No Food Insecurity  . Worried About Charity fundraiser in the Last Year: Never true  . Ran Out of Food in the Last Year: Never true  Transportation Needs: No Transportation Needs  . Lack of Transportation (Medical): No  . Lack of Transportation (Non-Medical): No  Physical Activity: Inactive  . Days of Exercise per Week: 0 days  . Minutes of Exercise per Session: 0 min  Stress: No Stress Concern Present  . Feeling of Stress : Not at all  Social Connections:   . Frequency of Communication with Friends and Family: Not on file  . Frequency of Social Gatherings  with Friends and Family: Not on file  . Attends Religious Services: Not on file  . Active Member of Clubs or Organizations: Not on file  . Attends Archivist Meetings: Not on file  . Marital Status: Not on file    Tobacco Counseling Ready to quit: Yes Counseling given: Not Answered Comment: intermittent smoker over past 40 years, quit at time up to 6 months   Clinical Intake:  Pre-visit preparation completed: Yes  Pain : 0-10 Pain Score: 9  Pain Type: Chronic pain Pain Location: Hip Pain Orientation: Left Pain Descriptors / Indicators: Aching, Burning Pain Onset: More than a month ago Pain Frequency: Constant Pain Relieving Factors: body and muscle helps  Pain Relieving Factors: body and muscle helps  Nutritional Status: BMI > 30  Obese Nutritional Risks: None Diabetes: Yes  How often do you need to have someone help you when  you read instructions, pamphlets, or other written materials from your doctor or pharmacy?: 1 - Never What is the last grade level you completed in school?: some college  Diabetic? Yes Nutrition Risk Assessment:  Has the patient had any N/V/D within the last 2 months?  No  Does the patient have any non-healing wounds?  No  Has the patient had any unintentional weight loss or weight gain?  No   Diabetes:  Is the patient diabetic?  Yes  If diabetic, was a CBG obtained today?  No  Did the patient bring in their glucometer from home?  No  How often do you monitor your CBG's? daily.   Financial Strains and Diabetes Management:  Are you having any financial strains with the device, your supplies or your medication? No .  Does the patient want to be seen by Chronic Care Management for management of their diabetes?  No  Would the patient like to be referred to a Nutritionist or for Diabetic Management?  No   Diabetic Exams:  Diabetic Eye Exam: Completed 10/11/2019 Diabetic Foot Exam: Completed 02/09/2019  Interpreter Needed?:  No  Information entered by :: NAllen LPN   Activities of Daily Living In your present state of health, do you have any difficulty performing the following activities: 10/19/2019  Hearing? N  Vision? N  Difficulty concentrating or making decisions? N  Walking or climbing stairs? Y  Comment a little  Dressing or bathing? N  Doing errands, shopping? N  Preparing Food and eating ? N  Using the Toilet? N  In the past six months, have you accidently leaked urine? N  Do you have problems with loss of bowel control? N  Managing your Medications? N  Managing your Finances? N  Housekeeping or managing your Housekeeping? N  Some recent data might be hidden    Patient Care Team: Malfi, Lupita Raider, FNP as PCP - General (Family Medicine) Parks Ranger Devonne Doughty, DO as Consulting Physician (Family Medicine)  Indicate any recent Medical Services you may have received from other than [facility] providers in the past year (date may be approximate).     Assessment:   This is a routine wellness examination for Alison Mays.  Hearing/Vision screen  Hearing Screening   _0  _1  _2  _3  _4  _5  _6  _7  _8   Right ear:           Left ear:           Vision Screening Comments: Regular eye exams, [facility]  Dietary issues and exercise activities discussed: Current Exercise Habits: The patient does not participate in regular exercise at present  Goals    . Patient Stated     10/19/2019, quit smoking and to get off diabetic medication    . Quit Smoking     Smoking cessation discussed      Depression Screen PHQ 2/9 Scores 10/19/2019 02/09/2019 07/07/2018 03/26/2018 07/01/2017 05/08/2017  PHQ - 2 Score 0 0 0 0 0 1  PHQ- 9 Score - - - - - 7    Fall Risk Fall Risk  10/19/2019 02/09/2019 07/07/2018 03/26/2018 07/01/2017  Falls in the past year? 1 0 0 0 Yes  Comment lost balance - - - -  Number falls in past yr: 1 0 - 0 1  Injury with Fall? 0 0 - - No  Risk for fall due to : History of  fall(s);Impaired balance/gait;Medication side effect - - - -  Follow up Falls evaluation completed;Education provided;Falls prevention discussed Falls evaluation completed -  Falls evaluation completed Falls prevention discussed    Any stairs in or around the home? No  If so, are there any without handrails? n/a Home free of loose throw rugs in walkways, pet beds, electrical cords, etc? Yes  Adequate lighting in your home to reduce risk of falls? Yes   ASSISTIVE DEVICES UTILIZED TO PREVENT FALLS:  Life alert? No  Use of a cane, walker or w/c? No  Grab bars in the bathroom? No  Shower chair or bench in shower? No  Elevated toilet seat or a handicapped toilet? No   TIMED UP AND GO:  Was the test performed? No .      Cognitive Function:     6CIT Screen 10/19/2019 07/07/2018 07/01/2017  What Year? 0 points 0 points 0 points  What month? 0 points 0 points 0 points  What time? 0 points 0 points 0 points  Count back from 20 0 points 0 points 0 points  Months in reverse 2 points 0 points 0 points  Repeat phrase 0 points 0 points 0 points  Total Score 2 0 0    Immunizations  There is no immunization history on file for this patient.  TDAP status: Due, Education has been provided regarding the importance of this vaccine. Advised may receive this vaccine at local pharmacy or Health Dept. Aware to provide a copy of the vaccination record if obtained from local pharmacy or Health Dept. Verbalized acceptance and understanding. Flu Vaccine status: Declined, Education has been provided regarding the importance of this vaccine but patient still declined. Advised may receive this vaccine at local pharmacy or Health Dept. Aware to provide a copy of the vaccination record if obtained from local pharmacy or Health Dept. Verbalized acceptance and understanding. Pneumococcal vaccine status: Declined,  Education has been provided regarding the importance of this vaccine but patient still declined.  Advised may receive this vaccine at local pharmacy or Health Dept. Aware to provide a copy of the vaccination record if obtained from local pharmacy or Health Dept. Verbalized acceptance and understanding.  Covid-19 vaccine status: Declined, Education has been provided regarding the importance of this vaccine but patient still declined. Advised may receive this vaccine at local pharmacy or Health Dept.or vaccine clinic. Aware to provide a copy of the vaccination record if obtained from local pharmacy or Health Dept. Verbalized acceptance and understanding.  Qualifies for Shingles Vaccine? Yes   Zostavax completed No   Shingrix Completed?: No.    Education has been provided regarding the importance of this vaccine. Patient has been advised to call insurance company to determine out of pocket expense if they have not yet received this vaccine. Advised may also receive vaccine at local pharmacy or Health Dept. Verbalized acceptance and understanding.  Screening Tests Health Maintenance  Topic Date Due  . Hepatitis C Screening  Never done  . DEXA SCAN  Never done  . MAMMOGRAM  11/09/2018  . OPHTHALMOLOGY EXAM  10/07/2019  . COVID-19 Vaccine (1) 11/04/2019 (Originally 09/29/1962)  . PNA vac Low Risk Adult (1 of 2 - PCV13) 02/09/2020 (Originally 09/28/2016)  . INFLUENZA VACCINE  04/20/2020 (Originally 08/22/2019)  . TETANUS/TDAP  10/18/2020 (Originally [birth year]1970)  . FOOT EXAM  02/09/2020  . HEMOGLOBIN A1C  02/09/2020  . COLONOSCOPY  01/06/2023    Health Maintenance  Health Maintenance Due  Topic Date Due  . Hepatitis C Screening  Never done  . DEXA SCAN  Never done  . MAMMOGRAM  11/09/2018  . OPHTHALMOLOGY EXAM  10/07/2019  Colorectal cancer screening: Completed 01/05/2018. Repeat every 5 years Mammogram status: due Bone Density status: due  Lung Cancer Screening: (Low Dose CT Chest recommended if Age 66-80 years, 30 pack-year currently smoking OR have quit w/in 15years.) does not qualify.     Lung Cancer Screening Referral: no  Additional Screening:  Hepatitis C Screening: does qualify;   Vision Screening: Recommended annual ophthalmology exams for early detection of glaucoma and other disorders of the eye. Is the patient up to date with their annual eye exam?  Yes  Who is the provider or what is the name of the office in which the patient attends annual eye exams? [facility] If pt is not established with a provider, would they like to be referred to a provider to establish care? Yes .   Dental Screening: Recommended annual dental exams for proper oral hygiene  Community Resource Referral / Chronic Care Management: CRR required this visit?  No   CCM required this visit?  No      Plan:     I have personally reviewed and noted the following in the patient's chart:   . Medical and social history . Use of alcohol, tobacco or illicit drugs  . Current medications and supplements . Functional ability and status . Nutritional status . Physical activity . Advanced directives . List of other physicians . Hospitalizations, surgeries, and ER visits in previous 12 months . Vitals . Screenings to include cognitive, depression, and falls . Referrals and appointments  In addition, I have reviewed and discussed with patient certain preventive protocols, quality metrics, and best practice recommendations. A written personalized care plan for preventive services as well as general preventive health recommendations were provided to patient.     Kellie Simmering, LPN   09/15/7491   Nurse Notes:

## 2019-10-19 NOTE — Patient Instructions (Signed)
Alison Mays , Thank you for taking time to come for your Medicare Wellness Visit. I appreciate your ongoing commitment to your health goals. Please review the following plan we discussed and let me know if I can assist you in the future.   Screening recommendations/referrals: Colonoscopy: completed 01/05/2018 Mammogram: due Bone Density: due Recommended yearly ophthalmology/optometry visit for glaucoma screening and checkup Recommended yearly dental visit for hygiene and checkup  Vaccinations: Influenza vaccine: decline Pneumococcal vaccine: decline Tdap vaccine: decline Shingles vaccine: decline   Covid-19: decline  Advanced directives: Advance directive discussed with you today.   Conditions/risks identified: smoking  Next appointment: Follow up in one year for your annual wellness visit    Preventive Care 20 Years and Older, Female Preventive care refers to lifestyle choices and visits with your health care provider that can promote health and wellness. What does preventive care include?  A yearly physical exam. This is also called an annual well check.  Dental exams once or twice a year.  Routine eye exams. Ask your health care provider how often you should have your eyes checked.  Personal lifestyle choices, including:  Daily care of your teeth and gums.  Regular physical activity.  Eating a healthy diet.  Avoiding tobacco and drug use.  Limiting alcohol use.  Practicing safe sex.  Taking low-dose aspirin every day.  Taking vitamin and mineral supplements as recommended by your health care provider. What happens during an annual well check? The services and screenings done by your health care provider during your annual well check will depend on your age, overall health, lifestyle risk factors, and family history of disease. Counseling  Your health care provider may ask you questions about your:  Alcohol use.  Tobacco use.  Drug use.  Emotional  well-being.  Home and relationship well-being.  Sexual activity.  Eating habits.  History of falls.  Memory and ability to understand (cognition).  Work and work Statistician.  Reproductive health. Screening  You may have the following tests or measurements:  Height, weight, and BMI.  Blood pressure.  Lipid and cholesterol levels. These may be checked every 5 years, or more frequently if you are over 48 years old.  Skin check.  Lung cancer screening. You may have this screening every year starting at age 57 if you have a 30-pack-year history of smoking and currently smoke or have quit within the past 15 years.  Fecal occult blood test (FOBT) of the stool. You may have this test every year starting at age 23.  Flexible sigmoidoscopy or colonoscopy. You may have a sigmoidoscopy every 5 years or a colonoscopy every 10 years starting at age 92.  Hepatitis C blood test.  Hepatitis B blood test.  Sexually transmitted disease (STD) testing.  Diabetes screening. This is done by checking your blood sugar (glucose) after you have not eaten for a while (fasting). You may have this done every 1-3 years.  Bone density scan. This is done to screen for osteoporosis. You may have this done starting at age 66.  Mammogram. This may be done every 1-2 years. Talk to your health care provider about how often you should have regular mammograms. Talk with your health care provider about your test results, treatment options, and if necessary, the need for more tests. Vaccines  Your health care provider may recommend certain vaccines, such as:  Influenza vaccine. This is recommended every year.  Tetanus, diphtheria, and acellular pertussis (Tdap, Td) vaccine. You may need a Td booster every  10 years.  Zoster vaccine. You may need this after age 30.  Pneumococcal 13-valent conjugate (PCV13) vaccine. One dose is recommended after age 47.  Pneumococcal polysaccharide (PPSV23) vaccine. One  dose is recommended after age 66. Talk to your health care provider about which screenings and vaccines you need and how often you need them. This information is not intended to replace advice given to you by your health care provider. Make sure you discuss any questions you have with your health care provider. Document Released: 02/03/2015 Document Revised: 09/27/2015 Document Reviewed: 11/08/2014 Elsevier Interactive Patient Education  2017 [place] Prevention in the Home Falls can cause injuries. They can happen to people of all ages. There are many things you can do to make your home safe and to help prevent falls. What can I do on the outside of my home?  Regularly fix the edges of walkways and driveways and fix any cracks.  Remove anything that might make you trip as you walk through a door, such as a raised step or threshold.  Trim any bushes or trees on the path to your home.  Use bright outdoor lighting.  Clear any walking paths of anything that might make someone trip, such as rocks or tools.  Regularly check to see if handrails are loose or broken. Make sure that both sides of any steps have handrails.  Any raised decks and porches should have guardrails on the edges.  Have any leaves, snow, or ice cleared regularly.  Use sand or salt on walking paths during winter.  Clean up any spills in your garage right away. This includes oil or grease spills. What can I do in the bathroom?  Use night lights.  Install grab bars by the toilet and in the tub and shower. Do not use towel bars as grab bars.  Use non-skid mats or decals in the tub or shower.  If you need to sit down in the shower, use a plastic, non-slip stool.  Keep the floor dry. Clean up any water that spills on the floor as soon as it happens.  Remove soap buildup in the tub or shower regularly.  Attach bath mats securely with double-sided non-slip rug tape.  Do not have throw rugs and other  things on the floor that can make you trip. What can I do in the bedroom?  Use night lights.  Make sure that you have a light by your bed that is easy to reach.  Do not use any sheets or blankets that are too big for your bed. They should not hang down onto the floor.  Have a firm chair that has side arms. You can use this for support while you get dressed.  Do not have throw rugs and other things on the floor that can make you trip. What can I do in the kitchen?  Clean up any spills right away.  Avoid walking on wet floors.  Keep items that you use a lot in easy-to-reach places.  If you need to reach something above you, use a strong step stool that has a grab bar.  Keep electrical cords out of the way.  Do not use floor polish or wax that makes floors slippery. If you must use wax, use non-skid floor wax.  Do not have throw rugs and other things on the floor that can make you trip. What can I do with my stairs?  Do not leave any items on the stairs.  Make sure  that there are handrails on both sides of the stairs and use them. Fix handrails that are broken or loose. Make sure that handrails are as long as the stairways.  Check any carpeting to make sure that it is firmly attached to the stairs. Fix any carpet that is loose or worn.  Avoid having throw rugs at the top or bottom of the stairs. If you do have throw rugs, attach them to the floor with carpet tape.  Make sure that you have a light switch at the top of the stairs and the bottom of the stairs. If you do not have them, ask someone to add them for you. What else can I do to help prevent falls?  Wear shoes that:  Do not have high heels.  Have rubber bottoms.  Are comfortable and fit you well.  Are closed at the toe. Do not wear sandals.  If you use a stepladder:  Make sure that it is fully opened. Do not climb a closed stepladder.  Make sure that both sides of the stepladder are locked into place.  Ask  someone to hold it for you, if possible.  Clearly mark and make sure that you can see:  Any grab bars or handrails.  First and last steps.  Where the edge of each step is.  Use tools that help you move around (mobility aids) if they are needed. These include:  Canes.  Walkers.  Scooters.  Crutches.  Turn on the lights when you go into a dark area. Replace any light bulbs as soon as they burn out.  Set up your furniture so you have a clear path. Avoid moving your furniture around.  If any of your floors are uneven, fix them.  If there are any pets around you, be aware of where they are.  Review your medicines with your doctor. Some medicines can make you feel dizzy. This can increase your chance of falling. Ask your doctor what other things that you can do to help prevent falls. This information is not intended to replace advice given to you by your health care provider. Make sure you discuss any questions you have with your health care provider. Document Released: 11/03/2008 Document Revised: 06/15/2015 Document Reviewed: 02/11/2014 Elsevier Interactive Patient Education  2017 [employer].

## 2019-11-11 ENCOUNTER — Other Ambulatory Visit: Payer: Self-pay | Admitting: Family Medicine

## 2019-11-11 DIAGNOSIS — G8929 Other chronic pain: Secondary | ICD-10-CM

## 2019-11-11 MED ORDER — CYCLOBENZAPRINE HCL 10 MG PO TABS: ORAL_TABLET | ORAL | 0 refills | Status: AC

## 2019-11-11 NOTE — Telephone Encounter (Signed)
Medication Refill - Medication: cyclobenzaprine (FLEXERIL) 10 MG tablet    Has the patient contacted their pharmacy? Yes.   (Agent: If no, request that the patient contact the pharmacy for the refill.) (Agent: If yes, when and what did the pharmacy advise?)  Preferred Pharmacy (with phone number or street name):  [facility] [place], [address]  [place] [address]  Phone: [phone] Fax: [phone]     Agent: Please be advised that RX refills may take up to 3 business days. We ask that you follow-up with your pharmacy.

## 2019-11-11 NOTE — Telephone Encounter (Signed)
Requested medication (s) are due for refill today: yes  Requested medication (s) are on the active medication list:yes  Last refill:  05/25/2019  Future visit scheduled: no  Notes to clinic:  this refill cannot be delegated    Requested Prescriptions  Pending Prescriptions Disp Refills   cyclobenzaprine (FLEXERIL) 10 MG tablet 270 tablet 1    Sig: Take 1 tablet 3x a day as needed for muscle spasms      Not Delegated - Analgesics:  Muscle Relaxants Failed - 11/11/2019  1:23 PM      Failed - This refill cannot be delegated      Passed - Valid encounter within last 6 months    Recent Outpatient Visits           3 months ago Left hip pain   [facility], Lupita Raider, FNP   5 months ago Hematuria, unspecified type   [facility], Lupita Raider, FNP   7 months ago Type 2 diabetes mellitus with peripheral neuropathy [facility])   [facility], Lupita Raider, FNP   8 months ago Type 2 diabetes mellitus with peripheral neuropathy [facility])   [facility], Lupita Raider, FNP   9 months ago Type 2 diabetes mellitus with peripheral neuropathy [facility])   [facility], Devonne Doughty, DO

## 2019-11-18 ENCOUNTER — Other Ambulatory Visit: Payer: Self-pay | Admitting: Family Medicine

## 2019-11-18 DIAGNOSIS — R0602 Shortness of breath: Secondary | ICD-10-CM

## 2020-02-02 ENCOUNTER — Other Ambulatory Visit: Payer: Self-pay | Admitting: Family Medicine

## 2020-02-02 DIAGNOSIS — R0602 Shortness of breath: Secondary | ICD-10-CM

## 2020-05-02 IMAGING — DX DG CHEST 2V
2 series · 2 of 2 positions shown · non-contrast
Comparison: None.

CLINICAL DATA: Acute chest pain and shortness of breath for 3
weeks.

EXAM:
CHEST - 2 VIEW

[chest pa]
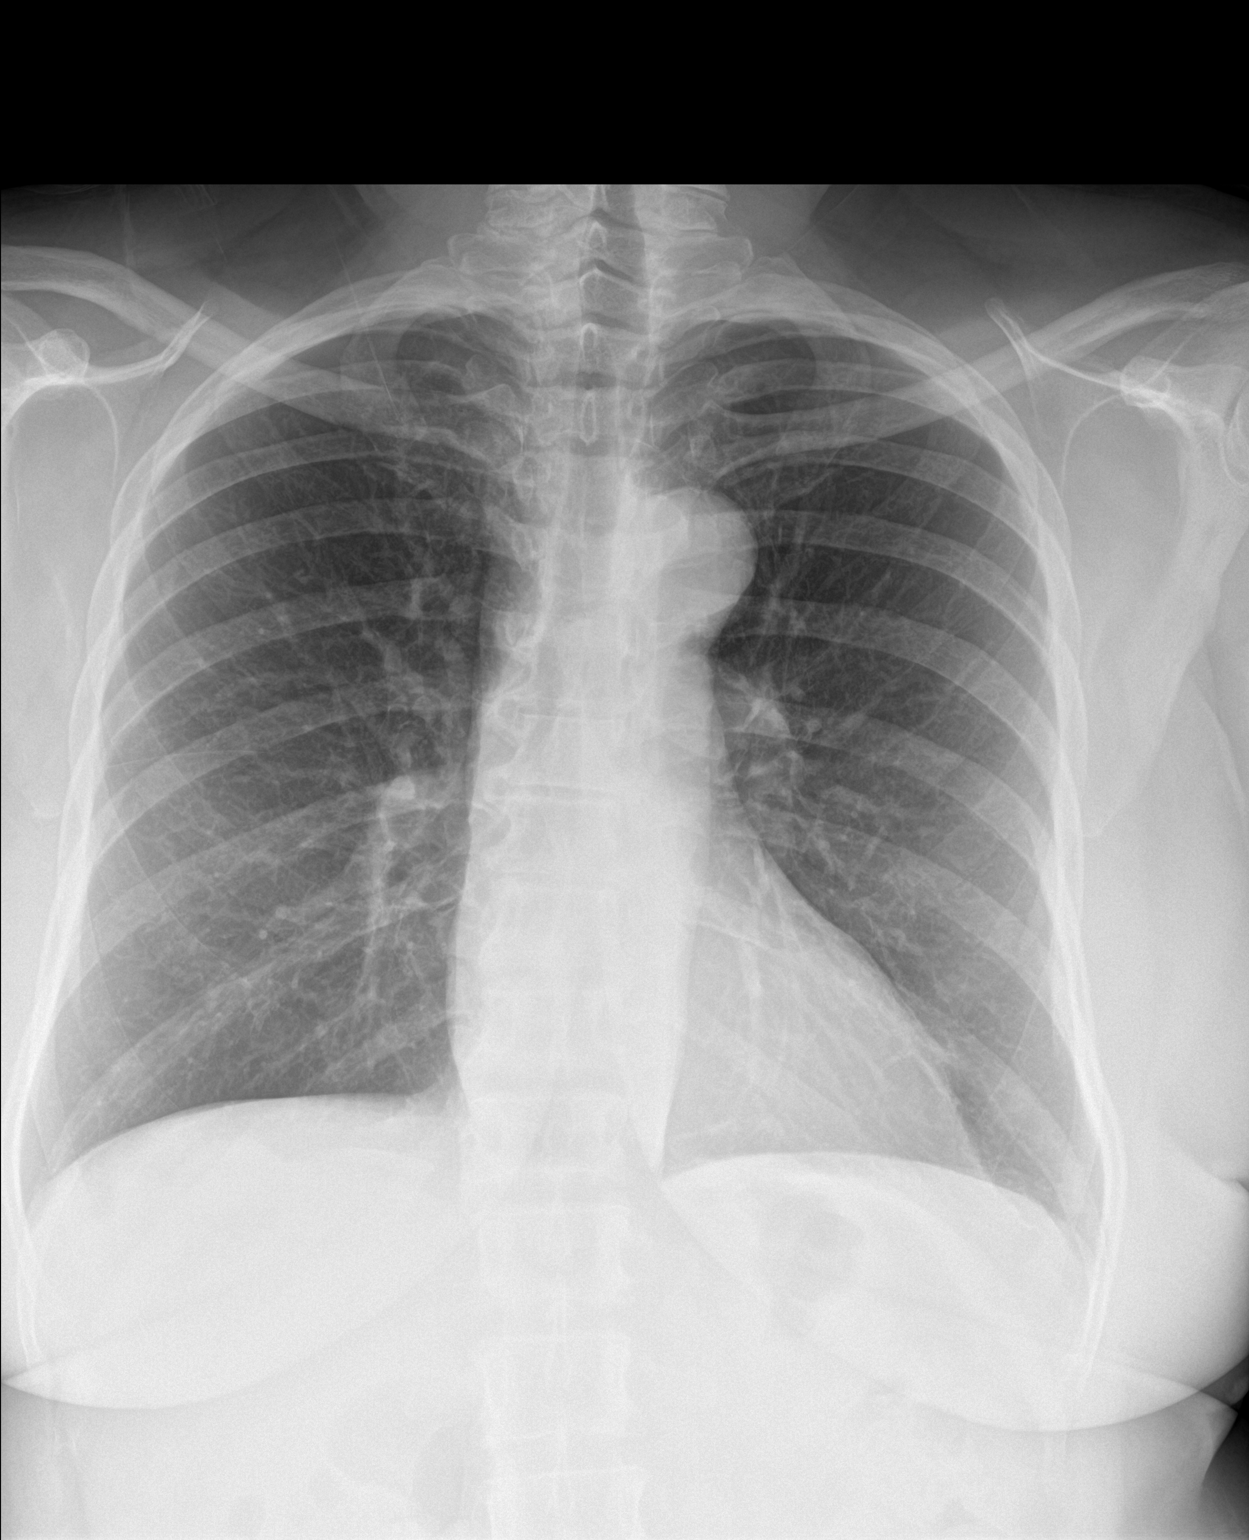

[chest lat]
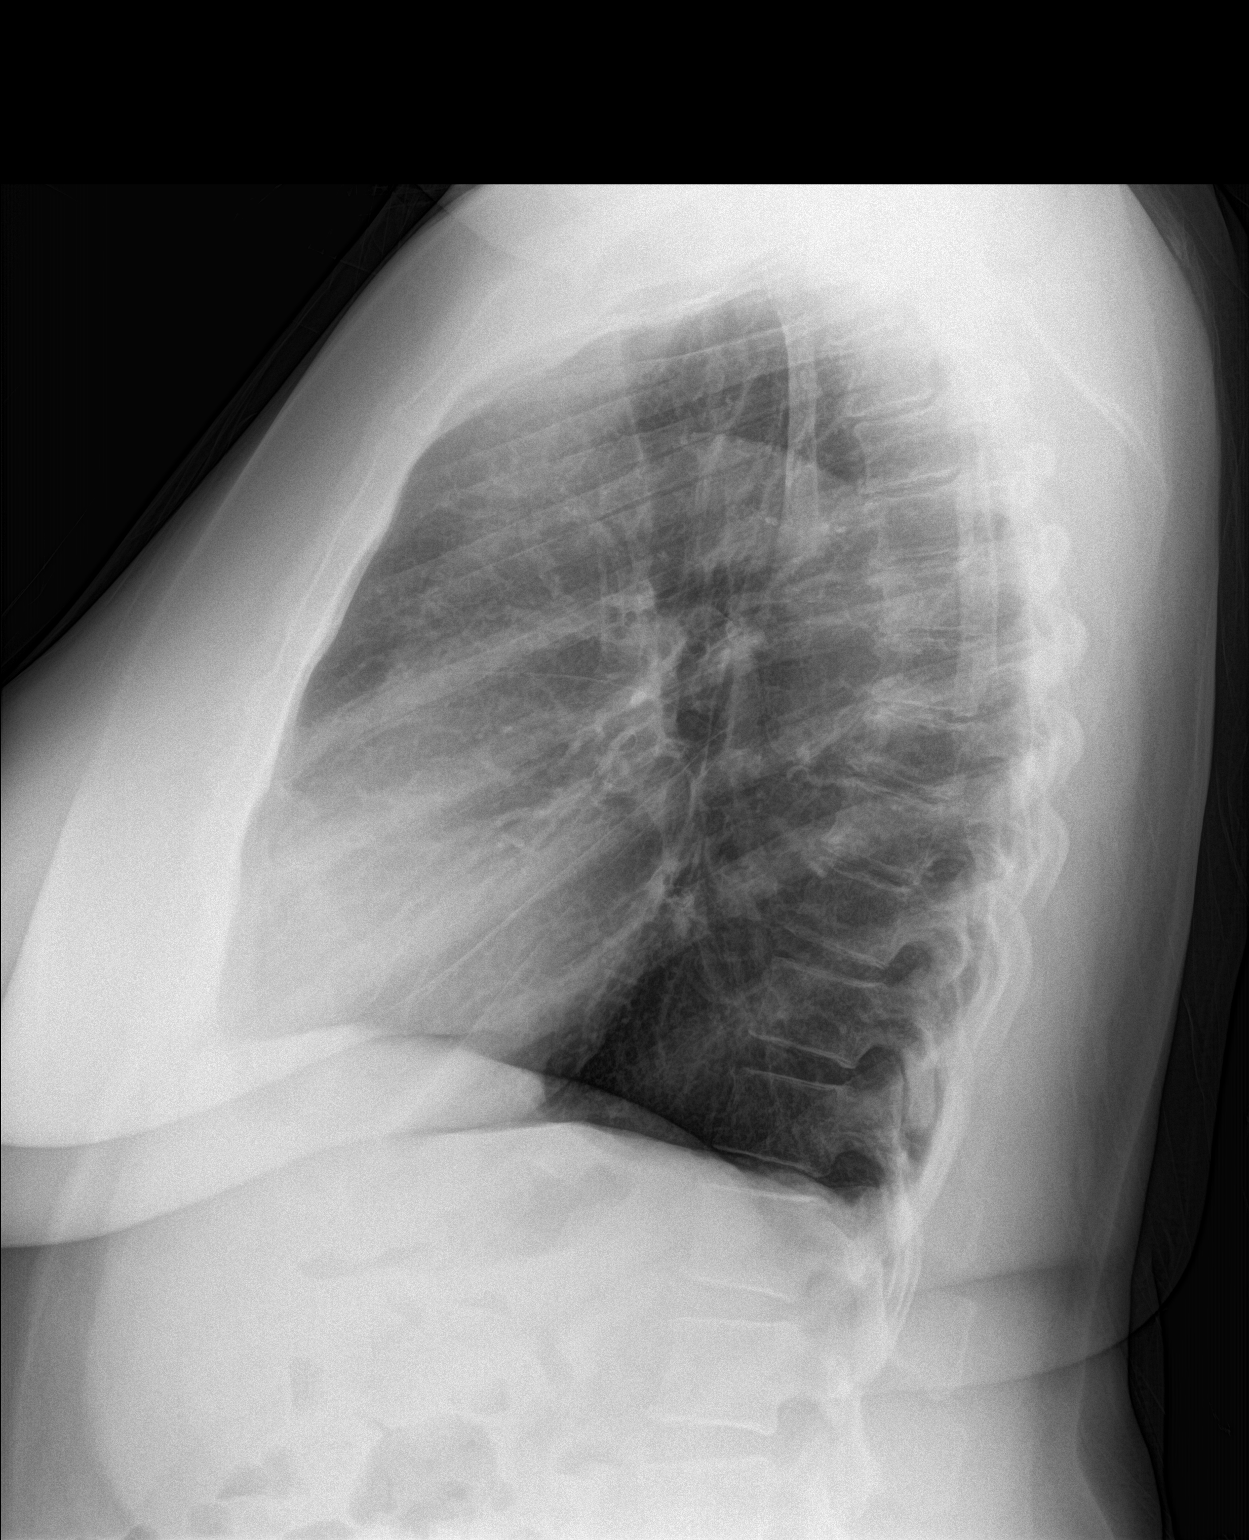

[2 of 2 positions shown; findings below may reference images not displayed]

FINDINGS: The cardiomediastinal silhouette is unremarkable.

There is no evidence of focal airspace disease, pulmonary edema,
suspicious pulmonary nodule/mass, pleural effusion, or pneumothorax.

No acute bony abnormalities are identified.
IMPRESSION: No active cardiopulmonary disease.

## 2022-08-20 ENCOUNTER — Telehealth: Payer: Self-pay

## 2022-08-20 NOTE — Telephone Encounter (Signed)
We mailed out a recall letter to patient telling patient it was time to call to schedule her repeat colonoscopy with Dr. Tobi Bastos. We got letter return in the mail

## 2022-08-20 NOTE — Telephone Encounter (Signed)
Called patient and she has moved to [place] and I updated her address. She states she will tell her new PCP that she is due for a repeat colonoscopy and get them to place a referral to a GI provider to do a colonoscopy.
